# Patient Record
Sex: Male | Born: 2000 | Race: White | Hispanic: No | Marital: Single | Smoking: Never smoker
Health system: Southern US, Community
[De-identification: ages and names within clinical notes are randomized; demographics above are authoritative.]

## PROBLEM LIST (undated history)

## (undated) DIAGNOSIS — G4700 Insomnia, unspecified: Secondary | ICD-10-CM

## (undated) DIAGNOSIS — F909 Attention-deficit hyperactivity disorder, unspecified type: Secondary | ICD-10-CM

## (undated) DIAGNOSIS — F419 Anxiety disorder, unspecified: Secondary | ICD-10-CM

## (undated) DIAGNOSIS — I10 Essential (primary) hypertension: Secondary | ICD-10-CM

## (undated) DIAGNOSIS — F32A Depression, unspecified: Secondary | ICD-10-CM

## (undated) HISTORY — PX: OTHER SURGICAL HISTORY: SHX169

## (undated) HISTORY — DX: Anxiety disorder, unspecified: F41.9

## (undated) HISTORY — DX: Depression, unspecified: F32.A

## (undated) HISTORY — DX: Insomnia, unspecified: G47.00

## (undated) HISTORY — DX: Attention-deficit hyperactivity disorder, unspecified type: F90.9

---
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
## 2021-01-07 ENCOUNTER — Other Ambulatory Visit: Payer: Self-pay

## 2021-01-07 ENCOUNTER — Telehealth (INDEPENDENT_AMBULATORY_CARE_PROVIDER_SITE_OTHER): Payer: BC Managed Care – PPO | Admitting: Child and Adolescent Psychiatry

## 2021-01-07 DIAGNOSIS — F418 Other specified anxiety disorders: Secondary | ICD-10-CM

## 2021-01-07 DIAGNOSIS — F3341 Major depressive disorder, recurrent, in partial remission: Secondary | ICD-10-CM | POA: Diagnosis not present

## 2021-01-07 DIAGNOSIS — F902 Attention-deficit hyperactivity disorder, combined type: Secondary | ICD-10-CM | POA: Diagnosis not present

## 2021-01-07 MED ORDER — SERTRALINE HCL 50 MG PO TABS: 75.0000 mg | ORAL_TABLET | Freq: Every day | ORAL | 1 refills | Status: DC

## 2021-01-07 MED ORDER — BUPROPION HCL ER (XL) 300 MG PO TB24: 300.0000 mg | ORAL_TABLET | Freq: Every day | ORAL | 0 refills | Status: DC

## 2021-01-07 MED ORDER — HYDROXYZINE HCL 25 MG PO TABS: 25.0000 mg | ORAL_TABLET | Freq: Three times a day (TID) | ORAL | 1 refills | Status: DC | PRN

## 2021-01-07 MED ORDER — TRAZODONE HCL 50 MG PO TABS: ORAL_TABLET | ORAL | 0 refills | Status: DC

## 2021-01-07 NOTE — Progress Notes (Signed)
Virtual Visit via Video Note  I connected with Kevin Wilkerson on 01/07/21 at 10:30 AM EST by a video enabled telemedicine application and verified that I am speaking with the correct person using two identifiers.  Location: Patient: home Provider: office   I discussed the limitations of evaluation and management by telemedicine and the availability of in person appointments. The patient expressed understanding and agreed to proceed.    I discussed the assessment and treatment plan with the patient. The patient was provided an opportunity to ask questions and all were answered. The patient agreed with the plan and demonstrated an understanding of the instructions.   The patient was advised to call back or seek an in-person evaluation if the symptoms worsen or if the condition fails to improve as anticipated.  I provided  20 minutes of non-face-to-face time during this encounter.   [name], MD    [facility] MD/PA/NP OP Progress Note  01/07/2021 11:25 AM Kevin Wilkerson  MRN:  [id number]  Chief Complaint:   Medication management follow-up for ADHD, anxiety, depression.    HPI:  This is an 20 year old Caucasian male with psychiatric history significant of ADHD, MDD, anxiety was last prescribed Wellbutrin XL 300 mg once a day, Jornay PM 20 mg once a day at 8:52 PM and Zoloft 50 mg once a day and hydroxyzine and trazodone as needed for sleeping difficulties.   Kevin Wilkerson was seen and evaluated over telemedicine encounter for medication management follow-up.  He was present by himself at his college campus.  He reports that he has made a decision to drop out of college because he has not been liking the work and also does not have money to support himself.  He reports that he despises the work.  He reports that he is planning to move back with his parents for a month, start working and then move to his own place.  He reports that he has been increasingly anxious because of moving back with parents  and finding a job.  He reports that his mood has been "okay", denies any low lows or depressed mood.  He denies anhedonia.  He reports that he is looking forward to start working rather than being at college.  He denies any SI/HI.  He reports that he has stayed compliant with his medications and medications have continued to help especially Jornay PM has been helping him getting up on time and helping him throughout the day until 4 PM for attention problems.  He also reports that he has continued to see his therapist about once a week.  I discussed with him to increase the dose of Zoloft to 75 mg once a day while keeping other current medications.  He verbalized understanding and agreed with this plan.  He will follow back again in 3 weeks or earlier if needed.   Visit Diagnosis:    ICD-10-CM   1. Recurrent major depressive disorder, in partial remission (HCC)  F33.41 buPROPion (WELLBUTRIN XL) 300 MG 24 hr tablet    sertraline (ZOLOFT) 50 MG tablet    2. Attention deficit hyperactivity disorder (ADHD), combined type  F90.2     3. Other specified anxiety disorders  F41.8 sertraline (ZOLOFT) 50 MG tablet    hydrOXYzine (ATARAX) 25 MG tablet       Past Psychiatric History: As mentioned in initial H&P, reviewed today, no change  Past Medical History:  Past Medical History:  Diagnosis Date   ADHD (attention deficit hyperactivity disorder)  Hypertension     Past Surgical History:  Procedure Laterality Date   arm surgery Left     Family Psychiatric History: As mentioned in initial H&P, reviewed today, no change  Family History:  Family History  Problem Relation Age of Onset   Bipolar disorder Mother    Depression Mother    Anxiety disorder Mother    Anxiety disorder Father    Depression Father    Panic disorder Brother     Social History:  Social History   Socioeconomic History   Marital status: Single    Spouse name: Not on file   Number of children: Not on file   Years  of education: Not on file   Highest education level: 11th grade  Occupational History   Not on file  Tobacco Use   Smoking status: Never   Smokeless tobacco: Never  Vaping Use   Vaping Use: Never used  Substance and Sexual Activity   Alcohol use: Never   Drug use: Never   Sexual activity: Never  Other Topics Concern   Not on file  Social History Narrative   Not on file   Social Determinants of Health   Financial Resource Strain: Not on file  Food Insecurity: Not on file  Transportation Needs: Not on file  Physical Activity: Not on file  Stress: Not on file  Social Connections: Not on file    Allergies: No Known Allergies  Metabolic Disorder Labs: No results found for: HGBA1C, MPG No results found for: PROLACTIN No results found for: CHOL, TRIG, HDL, CHOLHDL, VLDL, LDLCALC No results found for: TSH  Therapeutic Level Labs: No results found for: LITHIUM No results found for: VALPROATE No components found for:  CBMZ  Current Medications: Current Outpatient Medications  Medication Sig Dispense Refill   buPROPion (WELLBUTRIN XL) 300 MG 24 hr tablet Take 1 tablet (300 mg total) by mouth daily. 30 tablet 0   hydrOXYzine (ATARAX) 25 MG tablet Take 1 tablet (25 mg total) by mouth every 8 (eight) hours as needed for anxiety. Can take additional one tablet(25 mg total) at night for sleeping difficulties as needed. 30 tablet 1   Methylphenidate HCl ER, PM, (JORNAY PM) 20 MG CP24 Take 1 tablet (20 mg total) by mouth daily at 8-9 pm. 30 capsule 0   Methylphenidate HCl ER, PM, (JORNAY PM) 20 MG CP24 Take 1 tablet (20 mg total) by mouth daily at 8-9 pm. 30 capsule 0   sertraline (ZOLOFT) 50 MG tablet Take 1.5 tablets (75 mg total) by mouth daily. 45 tablet 1   traZODone (DESYREL) 50 MG tablet TAKE 1/2 TO 1 (ONE-HALF TO ONE) TABLET BY MOUTH AT BEDTIME PRN  FOR  SLEEP 30 tablet 0   No current facility-administered medications for this visit.     Musculoskeletal: Strength &  Muscle Tone: unable to assess since visit was over the telemedicine. Gait & Station: unable to assess since visit was over the telemedicine. Patient leans: N/A  Psychiatric Specialty Exam: Review of Systems  There were no vitals taken for this visit.There is no height or weight on file to calculate BMI.  General Appearance: Casual and Fairly Groomed  Eye Contact:  Fair  Speech:  Clear and Coherent and Normal Rate  Volume:  Normal  Mood:   "good..."  Affect:  Appropriate, Congruent, and Full Range  Thought Process:  Goal Directed and Linear  Orientation:  Full (Time, Place, and Person)  Thought Content: Logical   Suicidal Thoughts:  No  Homicidal Thoughts:  No  Memory:  Immediate;   Fair Recent;   Fair Remote;   Fair  Judgement:  Fair  Insight:  Fair  Psychomotor Activity:  Normal  Concentration:  Concentration: Fair and Attention Span: Fair  Recall:  [employer] of Knowledge: Fair  Language: Fair  Akathisia:  No    AIMS (if indicated): not done  Assets:  Communication Skills Desire for Improvement Financial Resources/Insurance Housing Leisure Time Physical Health Social Support Transportation Vocational/Educational  ADL's:  Intact  Cognition: WNL  Sleep:  Good     Screenings: PHQ2-9    Flowsheet Row Video Visit from [facility]/19/2020 in [facility]  PHQ-2 Total Score 2  PHQ-9 Total Score 13      Flowsheet Row ED from 03/07/2020 in [facility] [place]   C-SSRS RISK CATEGORY No Risk        Assessment and Plan:   20 year old Caucasian boy with 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   He reported history suggestive of significant anxiety, recurrent depression and ADHD on intake in the context of chronic psychosocial stressors and his genetic predisposition.    Reviewed response to current medications on 12/02.  He appears to have  worsening of anxiety in the context of dropping out of college, moving back with parents.  He denies symptoms consistent with MDD at this time.  He appears to have continued stability with his attention problem.  He has cut down on his marijuana and now smokes about twice a week.  He denies any other substance abuse.    Plan as below   Plan:   # Anxiety(chronic, unstable) - Increase Zoloft to 75 mg once a day.  - Atrax 25 mg TID PRN for anxiety and QHS PRN for sleeping difficulties.  - Continue Wellbutrin XL 300 mg once a day. - Therapy with Ms. Patty Sprouse   # Depression(recurrent, in remission) -Medication as mentioned above.    -Therapy with Ms. Patty Sprouse   # ADHD - Continue with Jornay PM 20 mg daily.    This note was generated in part or whole with voice recognition software. Voice recognition is usually quite accurate but there are transcription errors that can and very often do occur. I apologize for any typographical errors that were not detected and corrected.   MDM = 2 or more chronic conditions + med management    [name], MD 01/07/2021, 11:25 AM
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
## 2021-01-27 ENCOUNTER — Telehealth: Payer: BC Managed Care – PPO | Admitting: Child and Adolescent Psychiatry

## 2021-01-27 ENCOUNTER — Telehealth: Payer: Self-pay | Admitting: Child and Adolescent Psychiatry

## 2021-01-27 ENCOUNTER — Other Ambulatory Visit: Payer: Self-pay

## 2021-01-27 NOTE — Telephone Encounter (Signed)
Pt was sent link via text and email to connect on video for telemedicine encounter for scheduled appointment, and was also followed up with phone call. Pt did not connect on the video, and writer left the VM requesting to connect on the video or call back to reschedule appointment if they are not able to connect today for appointment.   

## 2021-02-22 ENCOUNTER — Telehealth (INDEPENDENT_AMBULATORY_CARE_PROVIDER_SITE_OTHER): Payer: BC Managed Care – PPO | Admitting: Child and Adolescent Psychiatry

## 2021-02-22 ENCOUNTER — Encounter: Payer: Self-pay | Admitting: Child and Adolescent Psychiatry

## 2021-02-22 ENCOUNTER — Other Ambulatory Visit: Payer: Self-pay

## 2021-02-22 DIAGNOSIS — F902 Attention-deficit hyperactivity disorder, combined type: Secondary | ICD-10-CM | POA: Diagnosis not present

## 2021-02-22 DIAGNOSIS — F418 Other specified anxiety disorders: Secondary | ICD-10-CM

## 2021-02-22 DIAGNOSIS — F331 Major depressive disorder, recurrent, moderate: Secondary | ICD-10-CM | POA: Diagnosis not present

## 2021-02-22 MED ORDER — BUPROPION HCL ER (XL) 300 MG PO TB24
300.0000 mg | ORAL_TABLET | Freq: Every day | ORAL | 0 refills | Status: DC
Start: 2021-02-22 — End: 2021-04-05

## 2021-02-22 MED ORDER — SERTRALINE HCL 100 MG PO TABS: 100.0000 mg | ORAL_TABLET | Freq: Every day | ORAL | 1 refills | Status: DC

## 2021-02-22 MED ORDER — TRAZODONE HCL 50 MG PO TABS
ORAL_TABLET | ORAL | 0 refills | Status: DC
Start: 2021-02-22 — End: 2021-04-05

## 2021-02-22 NOTE — Progress Notes (Signed)
Virtual Visit via Video Note  I connected with Kevin Wilkerson on 02/22/21 at  1:00 PM EST by a video enabled telemedicine application and verified that I am speaking with the correct person using two identifiers.  Location: Patient: home Provider: office   I discussed the limitations of evaluation and management by telemedicine and the availability of in person appointments. The patient expressed understanding and agreed to proceed.    I discussed the assessment and treatment plan with the patient. The patient was provided an opportunity to ask questions and all were answered. The patient agreed with the plan and demonstrated an understanding of the instructions.   The patient was advised to call back or seek an in-person evaluation if the symptoms worsen or if the condition fails to improve as anticipated.  I provided  20 minutes of non-face-to-face time during this encounter.   [name], MD    [facility] MD/PA/NP OP Progress Note  02/22/2021 1:31 PM Kevin Wilkerson  MRN:  [id number]  Chief Complaint:   Medication management follow-up for ADHD, anxiety, depression.  HPI:  This is a 21 year old Caucasian male with psychiatric history significant of ADHD, MDD, anxiety was last prescribed Wellbutrin XL 300 mg once a day, Jornay PM 20 mg once a day at 8:00 PM and Zoloft 50 mg once a day and hydroxyzine and trazodone as needed for sleeping difficulties.   Kevin Wilkerson was seen and evaluated over telemedicine encounter for medication management follow-up.  He was present by himself at his home and was evaluated alone.  He reports that he is doing "okay", has been feeling depressed on most days since he has moved back to his parent's home however recently went to [place] to meet his girlfriend and that has improved his mood.  He reports that he is looking for jobs, has applied to various different places and waiting to hear.  He reports that he realizes that he needs to get out of the room.  We  discussed to come up with routine where he at least spends 1 hour outside of his room and also fixes his sleep schedule.  He reports that his anxiety was much higher when he left the college but anxiety is improving.  He denies any SI/HI.  He reports that sometimes he overeats and has not been able to take his Jornay PM at night which regulates his appetite and improves his attention because he is not sleeping on time at night.  He reports that he continues to take his medications as prescribed, sees his therapist about once a week.  We discussed to increase the dose of Zoloft to 100 mg once a day while continuing rest of the current medications.  He verbalized understanding and agreed with the plan.  He reports that he has tolerated increased dose of Zoloft to 75 mg well after the last appointment.  He will follow back again in 6 weeks or earlier if needed.  Visit Diagnosis:    ICD-10-CM   1. Moderate episode of recurrent major depressive disorder (HCC)  F33.1 buPROPion (WELLBUTRIN XL) 300 MG 24 hr tablet    sertraline (ZOLOFT) 100 MG tablet    2. Other specified anxiety disorders  F41.8 sertraline (ZOLOFT) 100 MG tablet    3. Attention deficit hyperactivity disorder (ADHD), combined type  F90.2        Past Psychiatric History: As mentioned in initial H&P, reviewed today, no change  Past Medical History:  Past Medical History:  Diagnosis  Date   ADHD (attention deficit hyperactivity disorder)    Hypertension     Past Surgical History:  Procedure Laterality Date   arm surgery Left     Family Psychiatric History: As mentioned in initial H&P, reviewed today, no change  Family History:  Family History  Problem Relation Age of Onset   Bipolar disorder Mother    Depression Mother    Anxiety disorder Mother    Anxiety disorder Father    Depression Father    Panic disorder Brother     Social History:  Social History   Socioeconomic History   Marital status: Single    Spouse name:  Not on file   Number of children: Not on file   Years of education: Not on file   Highest education level: 11th grade  Occupational History   Not on file  Tobacco Use   Smoking status: Never   Smokeless tobacco: Never  Vaping Use   Vaping Use: Never used  Substance and Sexual Activity   Alcohol use: Never   Drug use: Never   Sexual activity: Never  Other Topics Concern   Not on file  Social History Narrative   Not on file   Social Determinants of Health   Financial Resource Strain: Not on file  Food Insecurity: Not on file  Transportation Needs: Not on file  Physical Activity: Not on file  Stress: Not on file  Social Connections: Not on file    Allergies: No Known Allergies  Metabolic Disorder Labs: No results found for: HGBA1C, MPG No results found for: PROLACTIN No results found for: CHOL, TRIG, HDL, CHOLHDL, VLDL, LDLCALC No results found for: TSH  Therapeutic Level Labs: No results found for: LITHIUM No results found for: VALPROATE No components found for:  CBMZ  Current Medications: Current Outpatient Medications  Medication Sig Dispense Refill   buPROPion (WELLBUTRIN XL) 300 MG 24 hr tablet Take 1 tablet (300 mg total) by mouth daily. 30 tablet 0   hydrOXYzine (ATARAX) 25 MG tablet Take 1 tablet (25 mg total) by mouth every 8 (eight) hours as needed for anxiety. Can take additional one tablet(25 mg total) at night for sleeping difficulties as needed. 30 tablet 1   Methylphenidate HCl ER, PM, (JORNAY PM) 20 MG CP24 Take 1 tablet (20 mg total) by mouth daily at 8-9 pm. 30 capsule 0   Methylphenidate HCl ER, PM, (JORNAY PM) 20 MG CP24 Take 1 tablet (20 mg total) by mouth daily at 8-9 pm. 30 capsule 0   sertraline (ZOLOFT) 100 MG tablet Take 1 tablet (100 mg total) by mouth daily. 30 tablet 1   traZODone (DESYREL) 50 MG tablet TAKE 1/2 TO 1 (ONE-HALF TO ONE) TABLET BY MOUTH AT BEDTIME PRN  FOR  SLEEP 30 tablet 0   No current facility-administered medications for  this visit.     Musculoskeletal: Strength & Muscle Tone: unable to assess since visit was over the telemedicine. Gait & Station: unable to assess since visit was over the telemedicine. Patient leans: N/A  Psychiatric Specialty Exam: Review of Systems  There were no vitals taken for this visit.There is no height or weight on file to calculate BMI.  General Appearance: Casual and Fairly Groomed  Eye Contact:  Fair  Speech:  Clear and Coherent and Normal Rate  Volume:  Normal  Mood:   "ok..."  Affect:  Appropriate, Congruent, and Restricted  Thought Process:  Goal Directed and Linear  Orientation:  Full (Time, Place, and Person)  Thought Content: Logical   Suicidal Thoughts:  No  Homicidal Thoughts:  No  Memory:  Immediate;   Fair Recent;   Fair Remote;   Fair  Judgement:  Fair  Insight:  Fair  Psychomotor Activity:  Normal  Concentration:  Concentration: Fair and Attention Span: Fair  Recall:  [employer] of Knowledge: Fair  Language: Fair  Akathisia:  No    AIMS (if indicated): not done  Assets:  Communication Skills Desire for Improvement Financial Resources/Insurance Housing Leisure Time Physical Health Social Support Transportation Vocational/Educational  ADL's:  Intact  Cognition: WNL  Sleep:  Poor     Screenings: PHQ2-9    Flowsheet Row Video Visit from 04/19/2020 in [facility]  PHQ-2 Total Score 2  PHQ-9 Total Score 13      Flowsheet Row ED from 03/07/2020 in [facility] [place]   C-SSRS RISK CATEGORY No Risk        Assessment and Plan:   21 year old Caucasian boy with 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   He reported history suggestive of significant anxiety, recurrent depression and ADHD on intake in the context of chronic psychosocial stressors and his genetic predisposition.    Reviewed response to current  medications on 12/02.  He appears to have worsening of mood and anxiety in the context of dropping out of college, moving back with parents and lack of job.   Recommending to increase Zoloft to 100 mg..  He appears to have continued stability with his attention problem.  He has cut down on his marijuana and now smokes about twice a week.  He denies any other substance abuse.    Plan as below   Plan:   # Anxiety(chronic, unstable) - Increase Zoloft to 100 mg once a day.  - Atrax 25 mg TID PRN for anxiety and QHS PRN for sleeping difficulties.  - Continue Wellbutrin XL 300 mg once a day. - Therapy with Ms. Patty Sprouse   # Depression(recurrent, moderate) -Medication as mentioned above.    -Therapy with Ms. Patty Sprouse   # ADHD - Continue with Jornay PM 20 mg daily.    This note was generated in part or whole with voice recognition software. Voice recognition is usually quite accurate but there are transcription errors that can and very often do occur. I apologize for any typographical errors that were not detected and corrected.   MDM = 2 or more chronic conditions + med management    [name], MD 02/22/2021, 1:31 PM

## 2021-03-29 ENCOUNTER — Telehealth: Payer: Self-pay

## 2021-03-29 DIAGNOSIS — F902 Attention-deficit hyperactivity disorder, combined type: Secondary | ICD-10-CM

## 2021-03-29 MED ORDER — JORNAY PM 20 MG PO CP24
ORAL_CAPSULE | ORAL | 0 refills | Status: DC
Start: 2021-03-29 — End: 2021-08-03

## 2021-03-29 NOTE — Telephone Encounter (Signed)
Rx sent 

## 2021-03-29 NOTE — Telephone Encounter (Signed)
received message that pt needs refills on methylphenidate.

## 2021-04-05 ENCOUNTER — Other Ambulatory Visit: Payer: Self-pay

## 2021-04-05 ENCOUNTER — Telehealth (INDEPENDENT_AMBULATORY_CARE_PROVIDER_SITE_OTHER): Payer: BC Managed Care – PPO | Admitting: Child and Adolescent Psychiatry

## 2021-04-05 DIAGNOSIS — F418 Other specified anxiety disorders: Secondary | ICD-10-CM | POA: Diagnosis not present

## 2021-04-05 DIAGNOSIS — F902 Attention-deficit hyperactivity disorder, combined type: Secondary | ICD-10-CM

## 2021-04-05 DIAGNOSIS — F331 Major depressive disorder, recurrent, moderate: Secondary | ICD-10-CM | POA: Diagnosis not present

## 2021-04-05 MED ORDER — BUPROPION HCL ER (XL) 300 MG PO TB24: 300.0000 mg | ORAL_TABLET | Freq: Every day | ORAL | 1 refills | Status: DC

## 2021-04-05 MED ORDER — JORNAY PM 20 MG PO CP24
ORAL_CAPSULE | ORAL | 0 refills | Status: DC
Start: 2021-04-05 — End: 2021-08-03

## 2021-04-05 MED ORDER — SERTRALINE HCL 100 MG PO TABS: 100.0000 mg | ORAL_TABLET | Freq: Every day | ORAL | 1 refills | Status: DC

## 2021-04-05 MED ORDER — TRAZODONE HCL 50 MG PO TABS: ORAL_TABLET | ORAL | 0 refills | Status: DC

## 2021-04-05 NOTE — Progress Notes (Signed)
Virtual Visit via Video Note  I connected with Kevin Wilkerson on 04/05/21 at 10:30 AM EST by a video enabled telemedicine application and verified that I am speaking with the correct person using two identifiers.  Location: Patient: home Provider: office   I discussed the limitations of evaluation and management by telemedicine and the availability of in person appointments. The patient expressed understanding and agreed to proceed.    I discussed the assessment and treatment plan with the patient. The patient was provided an opportunity to ask questions and all were answered. The patient agreed with the plan and demonstrated an understanding of the instructions.   The patient was advised to call back or seek an in-person evaluation if the symptoms worsen or if the condition fails to improve as anticipated.  I provided  20 minutes of non-face-to-face time during this encounter.   [name], MD    [facility] MD/PA/NP OP Progress Note  04/05/2021 11:20 AM Kevin Wilkerson  MRN:  [id number]  Chief Complaint:   Medication management follow-up for ADHD, anxiety, depression.  HPI:  This is a 21 year old Caucasian male with psychiatric history significant of ADHD, MDD, anxiety was last prescribed Wellbutrin XL 300 mg once a day, Jornay PM 20 mg once a day at 8:00 PM and Zoloft 50 mg once a day and hydroxyzine and trazodone as needed for sleeping difficulties.   Coden was seen and evaluated over telemedicine encounter for medication management follow-up.  He was present by himself at his home and was evaluated alone.  He reports that he is doing okay now but about a week ago he was feeling depressed.  He reports that he was overthinking, crying often, staying in his room a lot, still enjoys watching TV.  When asked if any specific stressor he reports that he has been visiting his girlfriend every week or so in [place] and whenever he comes back he feels depressed.  He is planning to go back to  [place] this weekend.  He also reports anxiety.  He wants to move out of his parent's home.  He reports that he is planning to find a job once he is back from [place] after this week and.  He reports that his parents are supportive and does not bother him but he would like to be more independent.  He reports that overall he has been sleeping well lately, sleep has been restful, goes to see his friends and [employer] and plays video games with them infrequently, otherwise he watches TV most of the time at home.  He reports that he has been eating well.  He denies any suicidal thoughts or homicidal thoughts.  He reports that he continues to see his therapist about once every week or every other week.  We discussed to get back into some routine, discussed to look into jobs as it may provide structure and routine for him and he may feel more accomplished and productive which would help with mood and anxiety.  He reports that he has been compliant to his medications.  From medication refill history, he does not seem to be compliant with his Jornay PM.  He denies problems with his medications.  We discussed to continue with current medications his depression seem situational when he returns from [place] and it improves over the time once he is at home.  Recommended to continue with therapy as well.  He will follow back again in a month or earlier if  needed.   Visit Diagnosis:    ICD-10-CM   1. Attention deficit hyperactivity disorder (ADHD), combined type  F90.2 Methylphenidate HCl ER, PM, (JORNAY PM) 20 MG CP24    2. Moderate episode of recurrent major depressive disorder (HCC)  F33.1 buPROPion (WELLBUTRIN XL) 300 MG 24 hr tablet    sertraline (ZOLOFT) 100 MG tablet    3. Other specified anxiety disorders  F41.8 sertraline (ZOLOFT) 100 MG tablet        Past Psychiatric History: As mentioned in initial H&P, reviewed today, no change  Past Medical History:  Past Medical History:  Diagnosis Date    ADHD (attention deficit hyperactivity disorder)    Hypertension     Past Surgical History:  Procedure Laterality Date   arm surgery Left     Family Psychiatric History: As mentioned in initial H&P, reviewed today, no change  Family History:  Family History  Problem Relation Age of Onset   Bipolar disorder Mother    Depression Mother    Anxiety disorder Mother    Anxiety disorder Father    Depression Father    Panic disorder Brother     Social History:  Social History   Socioeconomic History   Marital status: Single    Spouse name: Not on file   Number of children: Not on file   Years of education: Not on file   Highest education level: 11th grade  Occupational History   Not on file  Tobacco Use   Smoking status: Never   Smokeless tobacco: Never  Vaping Use   Vaping Use: Never used  Substance and Sexual Activity   Alcohol use: Never   Drug use: Never   Sexual activity: Never  Other Topics Concern   Not on file  Social History Narrative   Not on file   Social Determinants of Health   Financial Resource Strain: Not on file  Food Insecurity: Not on file  Transportation Needs: Not on file  Physical Activity: Not on file  Stress: Not on file  Social Connections: Not on file    Allergies: No Known Allergies  Metabolic Disorder Labs: No results found for: HGBA1C, MPG No results found for: PROLACTIN No results found for: CHOL, TRIG, HDL, CHOLHDL, VLDL, LDLCALC No results found for: TSH  Therapeutic Level Labs: No results found for: LITHIUM No results found for: VALPROATE No components found for:  CBMZ  Current Medications: Current Outpatient Medications  Medication Sig Dispense Refill   buPROPion (WELLBUTRIN XL) 300 MG 24 hr tablet Take 1 tablet (300 mg total) by mouth daily. 30 tablet 1   hydrOXYzine (ATARAX) 25 MG tablet Take 1 tablet (25 mg total) by mouth every 8 (eight) hours as needed for anxiety. Can take additional one tablet(25 mg total) at  night for sleeping difficulties as needed. 30 tablet 1   Methylphenidate HCl ER, PM, (JORNAY PM) 20 MG CP24 Take 1 tablet (20 mg total) by mouth daily at 8-9 pm. 30 capsule 0   Methylphenidate HCl ER, PM, (JORNAY PM) 20 MG CP24 Take 1 tablet (20 mg total) by mouth daily at 8-9 pm. 30 capsule 0   sertraline (ZOLOFT) 100 MG tablet Take 1 tablet (100 mg total) by mouth daily. 30 tablet 1   traZODone (DESYREL) 50 MG tablet TAKE 1/2 TO 1 (ONE-HALF TO ONE) TABLET BY MOUTH AT BEDTIME PRN  FOR  SLEEP 30 tablet 0   No current facility-administered medications for this visit.     Musculoskeletal: Strength & Muscle  Tone: unable to assess since visit was over the telemedicine. Gait & Station: unable to assess since visit was over the telemedicine. Patient leans: N/A  Psychiatric Specialty Exam: Review of Systems  There were no vitals taken for this visit.There is no height or weight on file to calculate BMI.  General Appearance: Casual and Disheveled  Eye Contact:  Fair  Speech:  Clear and Coherent and Normal Rate  Volume:  Normal  Mood:   "ok"  Affect:  Appropriate, Congruent, and Restricted  Thought Process:  Goal Directed and Linear  Orientation:  Full (Time, Place, and Person)  Thought Content: Logical   Suicidal Thoughts:  No  Homicidal Thoughts:  No  Memory:  Immediate;   Fair Recent;   Fair Remote;   Fair  Judgement:  Fair  Insight:  Fair  Psychomotor Activity:  Normal  Concentration:  Concentration: Fair and Attention Span: Fair  Recall:  [employer] of Knowledge: Fair  Language: Fair  Akathisia:  No    AIMS (if indicated): not done  Assets:  Communication Skills Desire for Improvement Financial Resources/Insurance Housing Leisure Time Physical Health Social Support Transportation Vocational/Educational  ADL's:  Intact  Cognition: WNL  Sleep:  Fair     Screenings: PHQ2-9    Flowsheet Row Video Visit [facility]/19/2020 in [facility]   PHQ-2 Total Score 2  PHQ-9 Total Score 13      Flowsheet Row ED from 03/07/2020 in [facility] [place]   C-SSRS RISK CATEGORY No Risk        Assessment and Plan:   21 year old Caucasian boy with 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   He reported history suggestive of significant anxiety, recurrent depression and ADHD on intake in the context of chronic psychosocial stressors and his genetic predisposition.    Reviewed response to current medications on 02/28.  He appears to have overall stability with mood and and anxiety. Does have intermittent depressive episode which appears situational. Continues to see his therapist.    Plan as below   Plan:   # Anxiety(chronic, stable) - Continue with Zoloft 100 mg once a day.  - Atrax 25 mg TID PRN for anxiety and QHS PRN for sleeping difficulties.  - Continue Wellbutrin XL 300 mg once a day. - Therapy with Ms. Patty Sprouse   # Depression(recurrent, mild) -Medication as mentioned above.    -Therapy with Ms. Patty Sprouse   # ADHD - Continue with Jornay PM 20 mg daily.    This note was generated in part or whole with voice recognition software. Voice recognition is usually quite accurate but there are transcription errors that can and very often do occur. I apologize for any typographical errors that were not detected and corrected.   MDM = 2 or more chronic conditions + med management    [name], MD 04/05/2021, 11:20 AM

## 2021-04-12 DIAGNOSIS — F410 Panic disorder [episodic paroxysmal anxiety] without agoraphobia: Secondary | ICD-10-CM | POA: Diagnosis not present

## 2021-04-25 DIAGNOSIS — F410 Panic disorder [episodic paroxysmal anxiety] without agoraphobia: Secondary | ICD-10-CM | POA: Diagnosis not present

## 2021-05-03 ENCOUNTER — Telehealth (INDEPENDENT_AMBULATORY_CARE_PROVIDER_SITE_OTHER): Payer: BC Managed Care – PPO | Admitting: Child and Adolescent Psychiatry

## 2021-05-03 ENCOUNTER — Other Ambulatory Visit: Payer: Self-pay

## 2021-05-03 DIAGNOSIS — F902 Attention-deficit hyperactivity disorder, combined type: Secondary | ICD-10-CM

## 2021-05-03 DIAGNOSIS — F330 Major depressive disorder, recurrent, mild: Secondary | ICD-10-CM

## 2021-05-03 DIAGNOSIS — F418 Other specified anxiety disorders: Secondary | ICD-10-CM | POA: Diagnosis not present

## 2021-05-03 MED ORDER — HYDROXYZINE HCL 25 MG PO TABS: 25.0000 mg | ORAL_TABLET | Freq: Three times a day (TID) | ORAL | 1 refills | Status: DC | PRN

## 2021-05-03 MED ORDER — TRAZODONE HCL 50 MG PO TABS: ORAL_TABLET | ORAL | 0 refills | Status: DC

## 2021-05-03 NOTE — Progress Notes (Signed)
Virtual Visit via Video Note ? ?I connected with Kevin Wilkerson on 05/03/21 at 10:00 AM EDT by a video enabled telemedicine application and verified that I am speaking with the correct person using two identifiers. ? ?Location: ?Patient: home ?Provider: office ?  ?I discussed the limitations of evaluation and management by telemedicine and the availability of in person appointments. The patient expressed understanding and agreed to proceed. ? ?  ?I discussed the assessment and treatment plan with the patient. The patient was provided an opportunity to ask questions and all were answered. The patient agreed with the plan and demonstrated an understanding of the instructions. ?  ?The patient was advised to call back or seek an in-person evaluation if the symptoms worsen or if the condition fails to improve as anticipated. ? ?I provided  20 minutes of non-face-to-face time during this encounter. ? ? ?Kevin SmallingHiren M Cabell Lazenby, MD ? ? ? ?BH MD/PA/NP OP Progress Note ? ?05/03/2021 10:44 AM ?Kevin Wilkerson  ?MRN:  [id number] ? ?Chief Complaint:   Medication management follow-up for ADHD, anxiety, depression.   ? ?HPI: ? ?This is a 21 year old Caucasian male with psychiatric history significant of ADHD, MDD, anxiety was last prescribed Wellbutrin XL 300 mg once a day, Jornay PM 20 mg once a day at 8:00 PM and Zoloft 100 mg once a day and hydroxyzine and trazodone as needed for sleeping difficulties.  ? ?Kevin BattenCody was seen and evaluated over telemedicine encounter for medication management follow-up.  He was present by himself at his home and was evaluated alone.  He reports that he is doing well now however about a week ago for about 1 to 2 weeks he was feeling depressed in the context of not being able to see his girlfriend.  He reports that he was able to get his girlfriend a ticket to travel to from [place] to hear and since then his mood has been better.  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  He reports during the week when he felt depressed, he did not have motivation to do things, had low appetite and excessive sleep.  He denied having any suicidal thoughts during that time.  At present he reports that his sleep routine is still a problem, his mood is "good", denies anhedonia and enjoys watching movies at home, appetite has improved and continues to deny any suicidal thoughts.  He denies having any anxiety problems except occasional panic attacks.  He reports that he uses coping skills and hydroxyzine to help him with his panic attacks.  Otherwise he denies any excessive worries or anxiety feelings.  He reports that he has been taking his medications without any side effects.  However when checked his compliance and his history of feeling his medications, it appeared that he does not feel his medications every 30 days.  Discussed the importance of medication adherence and his treatment.  He verbalizes understanding and agrees to work on this.  He continues to see his therapist about once a week and they are continuing to work on managing his panic attacks.  He will follow back again in about a month or earlier if needed. ? ?Visit Diagnosis:  ?  ICD-10-CM   ?1. Mild episode of recurrent major depressive disorder (HCC)  F33.0   ?  ?2. Attention deficit hyperactivity disorder (ADHD), combined type  F90.2   ?  ?3.  Other specified anxiety disorders  F41.8 hydrOXYzine (ATARAX) 25 MG tablet  ?  ? ? ? ? ?Past Psychiatric History: As mentioned in initial H&P, reviewed today, no change ? ?Past Medical History:  ?Past Medical History:  ?Diagnosis Date  ? ADHD (attention deficit hyperactivity disorder)   ? Hypertension   ?  ?Past Surgical History:  ?Procedure Laterality Date  ? arm surgery Left   ? ? ?Family Psychiatric History: As mentioned in initial H&P, reviewed today, no change ? ?Family History:  ?Family History  ?Problem Relation Age of Onset   ? Bipolar disorder Mother   ? Depression Mother   ? Anxiety disorder Mother   ? Anxiety disorder Father   ? Depression Father   ? Panic disorder Brother   ? ? ?Social History:  ?Social History  ? ?Socioeconomic History  ? Marital status: Single  ?  Spouse name: Not on file  ? Number of children: Not on file  ? Years of education: Not on file  ? Highest education level: 11th grade  ?Occupational History  ? Not on file  ?Tobacco Use  ? Smoking status: Never  ? Smokeless tobacco: Never  ?Vaping Use  ? Vaping Use: Never used  ?Substance and Sexual Activity  ? Alcohol use: Never  ? Drug use: Never  ? Sexual activity: Never  ?Other Topics Concern  ? Not on file  ?Social History Narrative  ? Not on file  ? ?Social Determinants of Health  ? ?Financial Resource Strain: Not on file  ?Food Insecurity: Not on file  ?Transportation Needs: Not on file  ?Physical Activity: Not on file  ?Stress: Not on file  ?Social Connections: Not on file  ? ? ?Allergies: No Known Allergies ? ?Metabolic Disorder Labs: ?No results found for: HGBA1C, MPG ?No results found for: PROLACTIN ?No results found for: CHOL, TRIG, HDL, CHOLHDL, VLDL, LDLCALC ?No results found for: TSH ? ?Therapeutic Level Labs: ?No results found for: LITHIUM ?No results found for: VALPROATE ?No components found for:  CBMZ ? ?Current Medications: ?Current Outpatient Medications  ?Medication Sig Dispense Refill  ? buPROPion (WELLBUTRIN XL) 300 MG 24 hr tablet Take 1 tablet (300 mg total) by mouth daily. 30 tablet 1  ? hydrOXYzine (ATARAX) 25 MG tablet Take 1 tablet (25 mg total) by mouth every 8 (eight) hours as needed for anxiety. Can take additional one tablet(25 mg total) at night for sleeping difficulties as needed. 30 tablet 1  ? Methylphenidate HCl ER, PM, (JORNAY PM) 20 MG CP24 Take 1 tablet (20 mg total) by mouth daily at 8-9 pm. 30 capsule 0  ? Methylphenidate HCl ER, PM, (JORNAY PM) 20 MG CP24 Take 1 tablet (20 mg total) by mouth daily at 8-9 pm. 30 capsule 0  ?  sertraline (ZOLOFT) 100 MG tablet Take 1 tablet (100 mg total) by mouth daily. 30 tablet 1  ? traZODone (DESYREL) 50 MG tablet TAKE 1/2 TO 1 (ONE-HALF TO ONE) TABLET BY MOUTH AT BEDTIME PRN  FOR  SLEEP 30 tablet 0  ? ?No current facility-administered medications for this visit.  ? ? ? ?Musculoskeletal: ?Strength & Muscle Tone: unable to assess since visit was over the telemedicine. ?Gait & Station: unable to assess since visit was over the telemedicine. ?Patient leans: N/A ? ?Psychiatric Specialty Exam: ?Review of Systems  ?There were no vitals taken for this visit.There is no height or weight on file to calculate BMI.  ?General Appearance: Casual and Disheveled  ?Eye Contact:  Fair  ?Speech:  Clear and Coherent and Normal Rate  ?Volume:  Normal  ?Mood:   "ok"  ?Affect:  Appropriate, Congruent, and Restricted  ?Thought Process:  Goal Directed and Linear  ?Orientation:  Full (Time, Place, and Person)  ?Thought Content: Logical   ?Suicidal Thoughts:  No  ?Homicidal Thoughts:  No  ?Memory:  Immediate;   Fair ?Recent;   Fair ?Remote;   Fair  ?Judgement:  Fair  ?Insight:  Fair  ?Psychomotor Activity:  Normal  ?Concentration:  Concentration: Fair and Attention Span: Fair  ?Recall:  Fair  ?Fund of Knowledge: Fair  ?Language: Fair  ?Akathisia:  No  ?  ?AIMS (if indicated): not done  ?Assets:  Communication Skills ?Desire for Improvement ?Financial Resources/Insurance ?Housing ?Leisure Time ?Physical Health ?Social Support ?Transportation ?Vocational/Educational  ?ADL's:  Intact  ?Cognition: WNL  ?Sleep:  Fair  ?  ? ?Screenings: ?PHQ2-9   ? ?Flowsheet Row Video Visit from 04/19/2020 in [facility]  ?PHQ-2 Total Score 2  ?PHQ-9 Total Score 13  ? ?  ? ?Flowsheet Row ED from 03/07/2020 in [facility]   ?C-SSRS RISK CATEGORY No Risk  ? ?  ? ? ? ?Assessment and Plan:  ? ?21 year old Caucasian boy with significant genetic predisposition to psychiatric illness and personal  psychiatric history of ADHD, MDD and anxiety disorders.   He reported history suggestive of significant anxiety, recurrent depression and ADHD on intake in the context of chronic psychosocial stressors and his gen

## 2021-06-01 DIAGNOSIS — F410 Panic disorder [episodic paroxysmal anxiety] without agoraphobia: Secondary | ICD-10-CM | POA: Diagnosis not present

## 2021-06-07 ENCOUNTER — Telehealth (INDEPENDENT_AMBULATORY_CARE_PROVIDER_SITE_OTHER): Payer: BC Managed Care – PPO | Admitting: Child and Adolescent Psychiatry

## 2021-06-07 DIAGNOSIS — F902 Attention-deficit hyperactivity disorder, combined type: Secondary | ICD-10-CM | POA: Diagnosis not present

## 2021-06-07 DIAGNOSIS — F418 Other specified anxiety disorders: Secondary | ICD-10-CM | POA: Diagnosis not present

## 2021-06-07 DIAGNOSIS — F3341 Major depressive disorder, recurrent, in partial remission: Secondary | ICD-10-CM

## 2021-06-07 MED ORDER — TRAZODONE HCL 50 MG PO TABS: ORAL_TABLET | ORAL | 0 refills | Status: DC

## 2021-06-07 MED ORDER — BUPROPION HCL ER (XL) 300 MG PO TB24: 300.0000 mg | ORAL_TABLET | Freq: Every day | ORAL | 1 refills | Status: DC

## 2021-06-07 NOTE — Progress Notes (Signed)
Virtual Visit via Video Note ? ?I connected with Kevin Wilkerson on 06/07/21 at 10:30 AM EDT by a video enabled telemedicine application and verified that I am speaking with the correct person using two identifiers. ? ?Location: ?Patient: home ?Provider: office ?  ?I discussed the limitations of evaluation and management by telemedicine and the availability of in person appointments. The patient expressed understanding and agreed to proceed. ? ?  ?I discussed the assessment and treatment plan with the patient. The patient was provided an opportunity to ask questions and all were answered. The patient agreed with the plan and demonstrated an understanding of the instructions. ?  ?The patient was advised to call back or seek an in-person evaluation if the symptoms worsen or if the condition fails to improve as anticipated. ? ?I provided 20 minutes of non-face-to-face time during this encounter. ? ? ?Orlene Erm, MD ? ? ? ?[facility] MD/PA/NP OP Progress Note ? ?06/07/2021 10:59 AM ?Kevin Wilkerson  ?MRN:  [id number] ? ?Chief Complaint:   Medication management follow-up for ADHD, anxiety and depression. ? ?HPI: ? ?This is a 21 year old Caucasian male with psychiatric history significant of ADHD, MDD, anxiety was last prescribed Wellbutrin XL 300 mg once a day, Jornay PM 20 mg once a day at 8:00 PM and Zoloft 100 mg once a day and hydroxyzine and trazodone as needed for sleeping difficulties.  ? ?Kevin Wilkerson was seen and evaluated over telemedicine encounter for medication management follow-up.  He was present by himself at his home and was evaluated alone. Appointment was attended by rotating medical student and pt and parent provided informed consent to allow med student attend the appointment.   ? ?Kevin Wilkerson reports that he is doing "good".  He reports that he is staying busy and that his helping him, he also has some routine on what he needs to do the next day and that has been helpful.  He reports that after his girlfriend left after  staying with him for about 2 weeks, he became depressed for about 4 days and eventually his mood improved.  He rates his mood around 5 out of 10, 10 being the best mood on most days, spends time doing chores around the home, watching movies, talking to his friends and his girlfriend.  He reports that he will be visiting his girlfriend in 2 weeks and therefore he is not looking for a job now but planning to start a job afterwards. Her reports anxiety is well managed and rates it at 4/10(10 = most anxious). ? ?He reports that things are going well at home, gets along well with his parents, his grandmother is moving so he will get a new room for himself.  He reports that he is sleeping well however on some days if he is not able to fall asleep within an hour of taking Jornay PM, he does not sleep well.  He reports that having poor sleep leads to more irritability which occurred last week but now he has been sleeping better.  He reports that he takes [place] PM every other day and whenever he takes it he feels more motivated and more focused. ? ?He denies any SI or HI.  He has been eating okay.  He denies any problems with his medications except as mentioned above for [facility] PM.  He also reports that he has been completely adherent to his medications.  We discussed to continue with current medicines and follow back again in 6 weeks or earlier if  needed.  He also continues to see his therapist at least every 2 weeks.  They are working on improving his anxiety in social situations such as workplaces. ?Visit Diagnosis:  ?  ICD-10-CM   ?1. Other specified anxiety disorders  F41.8   ?  ?2. Recurrent major depressive disorder, in partial remission (HCC)  F33.41 buPROPion (WELLBUTRIN XL) 300 MG 24 hr tablet  ?  ?3. Attention deficit hyperactivity disorder (ADHD), combined type  F90.2   ?  ? ? ? ? ?Past Psychiatric History: As mentioned in initial H&P, reviewed today, no change ? ?Past Medical History:  ?Past Medical History:   ?Diagnosis Date  ? ADHD (attention deficit hyperactivity disorder)   ? Hypertension   ?  ?Past Surgical History:  ?Procedure Laterality Date  ? arm surgery Left   ? ? ?Family Psychiatric History: As mentioned in initial H&P, reviewed today, no change ? ?Family History:  ?Family History  ?Problem Relation Age of Onset  ? Bipolar disorder Mother   ? Depression Mother   ? Anxiety disorder Mother   ? Anxiety disorder Father   ? Depression Father   ? Panic disorder Brother   ? ? ?Social History:  ?Social History  ? ?Socioeconomic History  ? Marital status: Single  ?  Spouse name: Not on file  ? Number of children: Not on file  ? Years of education: Not on file  ? Highest education level: 11th grade  ?Occupational History  ? Not on file  ?Tobacco Use  ? Smoking status: Never  ? Smokeless tobacco: Never  ?Vaping Use  ? Vaping Use: Never used  ?Substance and Sexual Activity  ? Alcohol use: Never  ? Drug use: Never  ? Sexual activity: Never  ?Other Topics Concern  ? Not on file  ?Social History Narrative  ? Not on file  ? ?Social Determinants of Health  ? ?Financial Resource Strain: Not on file  ?Food Insecurity: Not on file  ?Transportation Needs: Not on file  ?Physical Activity: Not on file  ?Stress: Not on file  ?Social Connections: Not on file  ? ? ?Allergies: No Known Allergies ? ?Metabolic Disorder Labs: ?No results found for: HGBA1C, MPG ?No results found for: PROLACTIN ?No results found for: CHOL, TRIG, HDL, CHOLHDL, VLDL, LDLCALC ?No results found for: TSH ? ?Therapeutic Level Labs: ?No results found for: LITHIUM ?No results found for: VALPROATE ?No components found for:  CBMZ ? ?Current Medications: ?Current Outpatient Medications  ?Medication Sig Dispense Refill  ? buPROPion (WELLBUTRIN XL) 300 MG 24 hr tablet Take 1 tablet (300 mg total) by mouth daily. 30 tablet 1  ? hydrOXYzine (ATARAX) 25 MG tablet Take 1 tablet (25 mg total) by mouth every 8 (eight) hours as needed for anxiety. Can take additional one  tablet(25 mg total) at night for sleeping difficulties as needed. 30 tablet 1  ? Methylphenidate HCl ER, PM, (JORNAY PM) 20 MG CP24 Take 1 tablet (20 mg total) by mouth daily at 8-9 pm. 30 capsule 0  ? Methylphenidate HCl ER, PM, (JORNAY PM) 20 MG CP24 Take 1 tablet (20 mg total) by mouth daily at 8-9 pm. 30 capsule 0  ? sertraline (ZOLOFT) 100 MG tablet Take 1 tablet (100 mg total) by mouth daily. 30 tablet 1  ? traZODone (DESYREL) 50 MG tablet TAKE 1/2 TO 1 (ONE-HALF TO ONE) TABLET BY MOUTH AT BEDTIME PRN  FOR  SLEEP 30 tablet 0  ? ?No current facility-administered medications for this visit.  ? ? ? ?  Musculoskeletal: ?Strength & Muscle Tone: unable to assess since visit was over the telemedicine. ?Gait & Station: unable to assess since visit was over the telemedicine. ?Patient leans: N/A ? ?Psychiatric Specialty Exam: ?Review of Systems  ?There were no vitals taken for this visit.There is no height or weight on file to calculate BMI.  ?General Appearance: Casual and Disheveled  ?Eye Contact:  Fair  ?Speech:  Clear and Coherent and Normal Rate  ?Volume:  Normal  ?Mood:   "good"  ?Affect:  Appropriate, Congruent, and Restricted  ?Thought Process:  Goal Directed and Linear  ?Orientation:  Full (Time, Place, and Person)  ?Thought Content: Logical   ?Suicidal Thoughts:  No  ?Homicidal Thoughts:  No  ?Memory:  Immediate;   Fair ?Recent;   Fair ?Remote;   Fair  ?Judgement:  Fair  ?Insight:  Fair  ?Psychomotor Activity:  Normal  ?Concentration:  Concentration: Fair and Attention Span: Fair  ?Recall:  Fair  ?Fund of Knowledge: Fair  ?Language: Fair  ?Akathisia:  No  ?  ?AIMS (if indicated): not done  ?Assets:  Communication Skills ?Desire for Improvement ?Financial Resources/Insurance ?Housing ?Leisure Time ?Physical Health ?Social Support ?Transportation ?Vocational/Educational  ?ADL's:  Intact  ?Cognition: WNL  ?Sleep:  Fair  ?  ? ?Screenings: ?PHQ2-9   ? ?Flowsheet Row Video Visit from 04/19/2020 in [facility]  ?PHQ-2 Total Score 2  ?PHQ-9 Total Score 13  ? ?  ? ?[place] ED from 03/07/2020 in [facility]   ?C-SSRS RISK CATEGORY No Risk  ? ?  ? ? ? ?Assessment and Plan:  ?

## 2021-06-14 DIAGNOSIS — F410 Panic disorder [episodic paroxysmal anxiety] without agoraphobia: Secondary | ICD-10-CM | POA: Diagnosis not present

## 2021-07-01 DIAGNOSIS — F410 Panic disorder [episodic paroxysmal anxiety] without agoraphobia: Secondary | ICD-10-CM | POA: Diagnosis not present

## 2021-07-19 ENCOUNTER — Telehealth: Payer: BC Managed Care – PPO | Admitting: Child and Adolescent Psychiatry

## 2021-07-19 ENCOUNTER — Telehealth: Payer: Self-pay | Admitting: Child and Adolescent Psychiatry

## 2021-07-19 NOTE — Telephone Encounter (Signed)
Pt was sent link via text to connect on video for telemedicine encounter for scheduled appointment, and was also followed up with phone call twice. Pt did not connect on the video, and writer left the VM requesting to connect on the video or call back to reschedule appointment if they are not able to connect today for appointment.

## 2021-07-28 DIAGNOSIS — F410 Panic disorder [episodic paroxysmal anxiety] without agoraphobia: Secondary | ICD-10-CM | POA: Diagnosis not present

## 2021-08-03 ENCOUNTER — Telehealth (INDEPENDENT_AMBULATORY_CARE_PROVIDER_SITE_OTHER): Payer: BC Managed Care – PPO | Admitting: Child and Adolescent Psychiatry

## 2021-08-03 DIAGNOSIS — F331 Major depressive disorder, recurrent, moderate: Secondary | ICD-10-CM | POA: Diagnosis not present

## 2021-08-03 DIAGNOSIS — F902 Attention-deficit hyperactivity disorder, combined type: Secondary | ICD-10-CM | POA: Diagnosis not present

## 2021-08-03 DIAGNOSIS — F418 Other specified anxiety disorders: Secondary | ICD-10-CM

## 2021-08-03 DIAGNOSIS — F3341 Major depressive disorder, recurrent, in partial remission: Secondary | ICD-10-CM | POA: Diagnosis not present

## 2021-08-03 MED ORDER — HYDROXYZINE HCL 25 MG PO TABS: 25.0000 mg | ORAL_TABLET | Freq: Three times a day (TID) | ORAL | 1 refills | Status: DC | PRN

## 2021-08-03 MED ORDER — TRAZODONE HCL 50 MG PO TABS: ORAL_TABLET | ORAL | 0 refills | Status: DC

## 2021-08-03 MED ORDER — AMPHETAMINE-DEXTROAMPHET ER 5 MG PO CP24: 5.0000 mg | ORAL_CAPSULE | Freq: Every day | ORAL | 0 refills | Status: DC

## 2021-08-03 MED ORDER — SERTRALINE HCL 100 MG PO TABS: 100.0000 mg | ORAL_TABLET | Freq: Every day | ORAL | 1 refills | Status: DC

## 2021-08-03 MED ORDER — BUPROPION HCL ER (XL) 300 MG PO TB24: 300.0000 mg | ORAL_TABLET | Freq: Every day | ORAL | 1 refills | Status: DC

## 2021-08-03 NOTE — Progress Notes (Signed)
Virtual Visit via Video Note  I connected with Kevin Wilkerson on 08/03/21 at  9:30 AM EDT by a video enabled telemedicine application and verified that I am speaking with the correct person using two identifiers.  Location: Patient: home Provider: office   I discussed the limitations of evaluation and management by telemedicine and the availability of in person appointments. The patient expressed understanding and agreed to proceed.    I discussed the assessment and treatment plan with the patient. The patient was provided an opportunity to ask questions and all were answered. The patient agreed with the plan and demonstrated an understanding of the instructions.   The patient was advised to call back or seek an in-person evaluation if the symptoms worsen or if the condition fails to improve as anticipated.    Orlene Erm, MD    [facility] MD/PA/NP OP Progress Note  08/03/2021 10:02 AM Kevin Wilkerson  MRN:  [id number]  Chief Complaint:   Medication management follow-up for ADHD, anxiety and depression.  HPI:  This is a 21 year old Caucasian male with psychiatric history significant of ADHD, MDD, anxiety was last prescribed Wellbutrin XL 300 mg once a day, Jornay PM 20 mg once a day at 8:00 PM and Zoloft 100 mg once a day and hydroxyzine and trazodone as needed for sleeping difficulties.   Zaven missed his last appointment as he was in [place], made appointment for follow-up today.  He was seen and evaluated over telemedicine encounter.  He was present by himself and was evaluated alone.  He reports that he was in [place] with his girlfriend for 3-1/2 weeks to attend her graduation ceremony and spend time with her.  He reports that he had a really good time with her, and his mood was very good.  He reports that since he came back about 3 days ago, his level down.  He reports that mood improves as they come up with a new plan to meet up again.  He reports that his girlfriend will be  moving to [place] to stay with him in about 3-1/2 weeks and his parents are okay with that.  He reports that they plan to work at a local [place] and saving up money to return to [place].  He reports that overall he has been "good", denies any low lows, denies excessive worries or anxiety.  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  He reports that he continues to struggle with sleep especially when he takes [place] PM, trazodone and hydroxyzine does help.  He reports that he has been spending his time applying to jobs recently.  He denies any SI or HI.  He reports that his appetite has been good, denies problems with energy.  He reports that he has not been focusing well and does not want to do [place] PM because it causes sleep problems.  We discussed that we can switch him back to Adderall XR 5 mg since he is not waking up in the morning and can take it.  He would like to go back on Adderall XR 5 mg once a day.  He reports that he has stayed compliant with rest of his medications.  He also has been seeing his therapist every 2 weeks.  He reports that he has been using marijuana since about last 4 to 5 months, uses about 2 or 3 times a week, has not  used since last 1-1/2 weeks since he does not have money, reports that it makes him feel calm.  His therapist is working with him on this and has also found a therapist who specializes in addiction treatment for him.  Psychoeducation was provided in regards of risks associated with cannabis use.  He was receptive to this.  He will continue with current medications and follow back again in about 6 weeks or earlier if needed.  Visit Diagnosis:    ICD-10-CM   1. Attention deficit hyperactivity disorder (ADHD), combined type  F90.2     2. Recurrent major depressive disorder, in partial remission (HCC)  F33.41     3. Moderate episode of recurrent major depressive disorder (HCC)  F33.1     4.  Other specified anxiety disorders  F41.8         Past Psychiatric History: As mentioned in initial H&P, reviewed today, no change  Past Medical History:  Past Medical History:  Diagnosis Date   ADHD (attention deficit hyperactivity disorder)    Hypertension     Past Surgical History:  Procedure Laterality Date   arm surgery Left     Family Psychiatric History: As mentioned in initial H&P, reviewed today, no change  Family History:  Family History  Problem Relation Age of Onset   Bipolar disorder Mother    Depression Mother    Anxiety disorder Mother    Anxiety disorder Father    Depression Father    Panic disorder Brother     Social History:  Social History   Socioeconomic History   Marital status: Single    Spouse name: Not on file   Number of children: Not on file   Years of education: Not on file   Highest education level: 11th grade  Occupational History   Not on file  Tobacco Use   Smoking status: Never   Smokeless tobacco: Never  Vaping Use   Vaping Use: Never used  Substance and Sexual Activity   Alcohol use: Never   Drug use: Never   Sexual activity: Never  Other Topics Concern   Not on file  Social History Narrative   Not on file   Social Determinants of Health   Financial Resource Strain: Not on file  Food Insecurity: Not on file  Transportation Needs: Not on file  Physical Activity: Not on file  Stress: Not on file  Social Connections: Not on file    Allergies: No Known Allergies  Metabolic Disorder Labs: No results found for: "HGBA1C", "MPG" No results found for: "PROLACTIN" No results found for: "CHOL", "TRIG", "HDL", "CHOLHDL", "VLDL", "LDLCALC" No results found for: "TSH"  Therapeutic Level Labs: No results found for: "LITHIUM" No results found for: "VALPROATE" No results found for: "CBMZ"  Current Medications: Current Outpatient Medications  Medication Sig Dispense Refill   buPROPion (WELLBUTRIN XL) 300 MG 24 hr tablet  Take 1 tablet (300 mg total) by mouth daily. 30 tablet 1   hydrOXYzine (ATARAX) 25 MG tablet Take 1 tablet (25 mg total) by mouth every 8 (eight) hours as needed for anxiety. Can take additional one tablet(25 mg total) at night for sleeping difficulties as needed. 30 tablet 1   sertraline (ZOLOFT) 100 MG tablet Take 1 tablet (100 mg total) by mouth daily. 30 tablet 1   traZODone (DESYREL) 50 MG tablet TAKE 1/2 TO 1 (ONE-HALF TO ONE) TABLET BY MOUTH AT BEDTIME PRN  FOR  SLEEP 30 tablet 0   No current facility-administered medications for  this visit.     Musculoskeletal: Strength & Muscle Tone: unable to assess since visit was over the telemedicine. Gait & Station: unable to assess since visit was over the telemedicine. Patient leans: N/A  Psychiatric Specialty Exam: Review of Systems  There were no vitals taken for this visit.There is no height or weight on file to calculate BMI.  General Appearance: Casual and Disheveled  Eye Contact:  Fair  Speech:  Clear and Coherent and Normal Rate  Volume:  Normal  Mood:   "good..."  Affect:  Appropriate, Congruent, and Restricted  Thought Process:  Goal Directed and Linear  Orientation:  Full (Time, Place, and Person)  Thought Content: Logical   Suicidal Thoughts:  No  Homicidal Thoughts:  No  Memory:  Immediate;   Fair Recent;   Fair Remote;   Fair  Judgement:  Fair  Insight:  Fair  Psychomotor Activity:  Normal  Concentration:  Concentration: Fair and Attention Span: Fair  Recall:  [employer] of Knowledge: Fair  Language: Fair  Akathisia:  No    AIMS (if indicated): not done  Assets:  Communication Skills Desire for Improvement Financial Resources/Insurance Housing Leisure Time Physical Health Social Support Transportation Vocational/Educational  ADL's:  Intact  Cognition: WNL  Sleep:  Fair     Screenings: PHQ2-9    Flowsheet Row Video Visit [facility]/19/2020 in [facility]  PHQ-2 Total Score  2  PHQ-9 Total Score 13      Flowsheet Row ED from 03/07/2020 in [facility] at [place]   C-SSRS RISK CATEGORY No Risk        Assessment and Plan:   21 year old Caucasian boy with 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   He reported history suggestive of significant anxiety, recurrent depression and ADHD on intake in the context of chronic psychosocial stressors and his genetic predisposition.    Reviewed response to current medications on 08/03/21.  He appears to have overall stability with mood and anxiety except intermittent worsening of mood when he comes back to home after visiting his girlfriend.  He also reports that he has not been taking [place] PM because it is impacting his sleep and therefore switching him to Adderall XR 5 mg which he was taking previously.  He continues to see his therapist about every 2 weeks.  He has been intermittently using marijuana and his therapist is referring to her therapist with addiction specialty.  Plan as mentioned below.     Plan:   # Anxiety(chronic, stable) - Continue with Zoloft 100 mg once a day.  - Atrax 25 mg TID PRN for anxiety and QHS PRN for sleeping difficulties.  - Continue Wellbutrin XL 300 mg once a day. - Therapy with Ms. Patty Sprouse   # Depression(recurrent, in partial remission) -Medication as mentioned above.    -Therapy with Ms. Patty Sprouse   # ADHD - Start Adderall XR 5 mg daily.     This note was generated in part or whole with voice recognition software. Voice recognition is usually quite accurate but there are transcription errors that can and very often do occur. I apologize for any typographical errors that were not detected and corrected.   MDM = 2 or more chronic conditions + med management    [name], MD 08/03/2021, 10:02 AM

## 2021-08-19 DIAGNOSIS — F410 Panic disorder [episodic paroxysmal anxiety] without agoraphobia: Secondary | ICD-10-CM | POA: Diagnosis not present

## 2021-08-23 DIAGNOSIS — F410 Panic disorder [episodic paroxysmal anxiety] without agoraphobia: Secondary | ICD-10-CM | POA: Diagnosis not present

## 2021-08-29 DIAGNOSIS — F410 Panic disorder [episodic paroxysmal anxiety] without agoraphobia: Secondary | ICD-10-CM | POA: Diagnosis not present

## 2021-09-06 DIAGNOSIS — F410 Panic disorder [episodic paroxysmal anxiety] without agoraphobia: Secondary | ICD-10-CM | POA: Diagnosis not present

## 2021-09-20 ENCOUNTER — Telehealth (INDEPENDENT_AMBULATORY_CARE_PROVIDER_SITE_OTHER): Payer: BC Managed Care – PPO | Admitting: Child and Adolescent Psychiatry

## 2021-09-20 DIAGNOSIS — F331 Major depressive disorder, recurrent, moderate: Secondary | ICD-10-CM | POA: Diagnosis not present

## 2021-09-20 DIAGNOSIS — F3341 Major depressive disorder, recurrent, in partial remission: Secondary | ICD-10-CM

## 2021-09-20 DIAGNOSIS — F418 Other specified anxiety disorders: Secondary | ICD-10-CM | POA: Diagnosis not present

## 2021-09-20 DIAGNOSIS — F902 Attention-deficit hyperactivity disorder, combined type: Secondary | ICD-10-CM

## 2021-09-20 MED ORDER — AMPHETAMINE-DEXTROAMPHET ER 5 MG PO CP24: 5.0000 mg | ORAL_CAPSULE | Freq: Every day | ORAL | 0 refills | Status: DC

## 2021-09-20 MED ORDER — TRAZODONE HCL 50 MG PO TABS: 50.0000 mg | ORAL_TABLET | Freq: Every day | ORAL | 1 refills | Status: DC

## 2021-09-20 MED ORDER — SERTRALINE HCL 100 MG PO TABS: 100.0000 mg | ORAL_TABLET | Freq: Every day | ORAL | 1 refills | Status: DC

## 2021-09-20 MED ORDER — BUPROPION HCL ER (XL) 300 MG PO TB24: 300.0000 mg | ORAL_TABLET | Freq: Every day | ORAL | 1 refills | Status: DC

## 2021-09-20 NOTE — Progress Notes (Signed)
Virtual Visit via Video Note  I connected with Kevin Wilkerson on 09/20/21 at  9:30 AM EDT by a video enabled telemedicine application and verified that I am speaking with the correct person using two identifiers.  Location: Patient: home Provider: office   I discussed the limitations of evaluation and management by telemedicine and the availability of in person appointments. The patient expressed understanding and agreed to proceed.    I discussed the assessment and treatment plan with the patient. The patient was provided an opportunity to ask questions and all were answered. The patient agreed with the plan and demonstrated an understanding of the instructions.   The patient was advised to call back or seek an in-person evaluation if the symptoms worsen or if the condition fails to improve as anticipated.    [name], MD    [facility] MD/PA/NP OP Progress Note  09/20/2021 10:03 AM Kevin Wilkerson  MRN:  [id number]  Chief Complaint:   Medication management follow-up for ADHD, anxiety and depression.  HPI:  This is a 21 year old Caucasian male with psychiatric history significant of ADHD, MDD, anxiety was last prescribed Wellbutrin XL 300 mg once a day, Adderall XR 5 mg once a day and Zoloft 100 mg once a day and hydroxyzine and trazodone as needed for sleeping difficulties.   Brenon was seen and evaluated over telemedicine encounter for medication management follow-up.  He was present by himself and was evaluated alone.  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  He reports that he really likes it, it pays him well and his parents agreed that if he holds full-time job for at least a month then he can have his girlfriend move in with them.  He reports that he had anxiety on his day 1 however since then he has been adjusting well to his work  and he is able to manage his anxiety.  He reports that his mood is good but he gets sad since he has not seen his girlfriend for the last 2 months.  He reports that he has been talking to his girlfriend on the day he is off and then the second he spends it with his family.  He reports that things are going well with his parents.  He denies any problems with his sleep when he takes trazodone.  He reports that because of his night shifts, he comes back home at 6:00 in the morning, stays up until 12, takes trazodone, sleeps about 8 hours and then when he wakes up he takes his Zoloft, Wellbutrin and Adderall.  He reports that Adderall has been "a life saver".  Because it has helped him stay focused and manage his work.  He denies any SI or HI.  He reports that he has been eating well.  He reports that he has decent amount of energy throughout the day.  He denies any substance abuse except for marijuana which she reports that he has been using it about once a week on the day he is off.  He reports that he is planning to continue to cut down on it.  We discussed to continue with current medications and follow back in about 6 weeks or earlier if needed.  He continues to see his therapist about once every 2 weeks.  Visit Diagnosis:  ICD-10-CM   1. Attention deficit hyperactivity disorder (ADHD), combined type  F90.2 amphetamine-dextroamphetamine (ADDERALL XR) 5 MG 24 hr capsule    amphetamine-dextroamphetamine (ADDERALL XR) 5 MG 24 hr capsule    2. Recurrent major depressive disorder, in partial remission (HCC)  F33.41 buPROPion (WELLBUTRIN XL) 300 MG 24 hr tablet    3. Moderate episode of recurrent major depressive disorder (HCC)  F33.1 sertraline (ZOLOFT) 100 MG tablet    4. Other specified anxiety disorders  F41.8 sertraline (ZOLOFT) 100 MG tablet        Past Psychiatric History: As mentioned in initial H&P, reviewed today, no change  Past Medical History:  Past Medical History:  Diagnosis Date    ADHD (attention deficit hyperactivity disorder)    Hypertension     Past Surgical History:  Procedure Laterality Date   arm surgery Left     Family Psychiatric History: As mentioned in initial H&P, reviewed today, no change  Family History:  Family History  Problem Relation Age of Onset   Bipolar disorder Mother    Depression Mother    Anxiety disorder Mother    Anxiety disorder Father    Depression Father    Panic disorder Brother     Social History:  Social History   Socioeconomic History   Marital status: Single    Spouse name: Not on file   Number of children: Not on file   Years of education: Not on file   Highest education level: 11th grade  Occupational History   Not on file  Tobacco Use   Smoking status: Never   Smokeless tobacco: Never  Vaping Use   Vaping Use: Never used  Substance and Sexual Activity   Alcohol use: Never   Drug use: Never   Sexual activity: Never  Other Topics Concern   Not on file  Social History Narrative   Not on file   Social Determinants of Health   Financial Resource Strain: Not on file  Food Insecurity: Not on file  Transportation Needs: Not on file  Physical Activity: Not on file  Stress: Not on file  Social Connections: Not on file    Allergies: No Known Allergies  Metabolic Disorder Labs: No results found for: "HGBA1C", "MPG" No results found for: "PROLACTIN" No results found for: "CHOL", "TRIG", "HDL", "CHOLHDL", "VLDL", "LDLCALC" No results found for: "TSH"  Therapeutic Level Labs: No results found for: "LITHIUM" No results found for: "VALPROATE" No results found for: "CBMZ"  Current Medications: Current Outpatient Medications  Medication Sig Dispense Refill   amphetamine-dextroamphetamine (ADDERALL XR) 5 MG 24 hr capsule Take 1 capsule (5 mg total) by mouth daily. 30 capsule 0   amphetamine-dextroamphetamine (ADDERALL XR) 5 MG 24 hr capsule Take 1 capsule (5 mg total) by mouth daily. 30 capsule 0    buPROPion (WELLBUTRIN XL) 300 MG 24 hr tablet Take 1 tablet (300 mg total) by mouth daily. 30 tablet 1   hydrOXYzine (ATARAX) 25 MG tablet Take 1 tablet (25 mg total) by mouth every 8 (eight) hours as needed for anxiety. Can take additional one tablet(25 mg total) at night for sleeping difficulties as needed. 30 tablet 1   sertraline (ZOLOFT) 100 MG tablet Take 1 tablet (100 mg total) by mouth daily. 30 tablet 1   traZODone (DESYREL) 50 MG tablet Take 1 tablet (50 mg total) by mouth at bedtime. TAKE 1/2 TO 1 (ONE-HALF TO ONE) TABLET BY MOUTH AT BEDTIME PRN  FOR  SLEEP 30 tablet 1   No  current facility-administered medications for this visit.     Musculoskeletal: Strength & Muscle Tone: unable to assess since visit was over the telemedicine. Gait & Station: unable to assess since visit was over the telemedicine. Patient leans: N/A  Psychiatric Specialty Exam: Review of Systems  There were no vitals taken for this visit.There is no height or weight on file to calculate BMI.  General Appearance: Casual and Disheveled  Eye Contact:  Fair  Speech:  Clear and Coherent and Normal Rate  Volume:  Normal  Mood:   "good..."  Affect:  Appropriate, Congruent, and Restricted  Thought Process:  Goal Directed and Linear  Orientation:  Full (Time, Place, and Person)  Thought Content: Logical   Suicidal Thoughts:  No  Homicidal Thoughts:  No  Memory:  Immediate;   Fair Recent;   Fair Remote;   Fair  Judgement:  Fair  Insight:  Fair  Psychomotor Activity:  Normal  Concentration:  Concentration: Fair and Attention Span: Fair  Recall:  [employer] of Knowledge: Fair  Language: Fair  Akathisia:  No    AIMS (if indicated): not done  Assets:  Communication Skills Desire for Improvement Financial Resources/Insurance Housing Leisure Time Physical Health Social Support Transportation Vocational/Educational  ADL's:  Intact  Cognition: WNL  Sleep:  Fair     Screenings: PHQ2-9    Flowsheet  Row Video Visit [facility]19/[facility]  PHQ-2 Total Score 2  PHQ-9 Total Score 13      Flowsheet Row ED from 03/07/2020 in [facility] [place]   C-SSRS RISK CATEGORY No Risk        Assessment and Plan:   21 year old Caucasian boy with 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   He reported history suggestive of significant anxiety, recurrent depression and ADHD on intake in the context of chronic psychosocial stressors and his genetic predisposition.    Reviewed response to current medications on 09/20/2021.  He appears to have continued stability with his mood and anxiety, he has started working full-time and that seems to have helped both financially and with his mental health.  We discussed to continue with current medications and continue follow-up with his therapist every 2 weeks.    Plan as mentioned below.     Plan:   # Anxiety(chronic, stable) - Continue with Zoloft 100 mg once a day.  - Atrax 25 mg TID PRN for anxiety and QHS PRN for sleeping difficulties.  - Continue Wellbutrin XL 300 mg once a day. - Therapy with Ms. Patty Sprouse   # Depression(recurrent, in partial remission) -Medication as mentioned above.    -Therapy with Ms. Patty Sprouse   # ADHD - Continue with Adderall XR 5 mg daily.    # Sleep - Continue with Trazodone 50 mg once a day for sleep   This note was generated in part or whole with voice recognition software. Voice recognition is usually quite accurate but there are transcription errors that can and very often do occur. I apologize for any typographical errors that were not detected and corrected.   MDM = 2 or more chronic conditions + med management    [name], MD 09/20/2021, 10:03 AM

## 2021-10-04 DIAGNOSIS — F410 Panic disorder [episodic paroxysmal anxiety] without agoraphobia: Secondary | ICD-10-CM | POA: Diagnosis not present

## 2021-10-25 DIAGNOSIS — F410 Panic disorder [episodic paroxysmal anxiety] without agoraphobia: Secondary | ICD-10-CM | POA: Diagnosis not present

## 2021-10-31 DIAGNOSIS — F410 Panic disorder [episodic paroxysmal anxiety] without agoraphobia: Secondary | ICD-10-CM | POA: Diagnosis not present

## 2021-11-11 ENCOUNTER — Telehealth (INDEPENDENT_AMBULATORY_CARE_PROVIDER_SITE_OTHER): Payer: BC Managed Care – PPO | Admitting: Child and Adolescent Psychiatry

## 2021-11-11 DIAGNOSIS — F3341 Major depressive disorder, recurrent, in partial remission: Secondary | ICD-10-CM | POA: Diagnosis not present

## 2021-11-11 DIAGNOSIS — F331 Major depressive disorder, recurrent, moderate: Secondary | ICD-10-CM

## 2021-11-11 DIAGNOSIS — F902 Attention-deficit hyperactivity disorder, combined type: Secondary | ICD-10-CM | POA: Diagnosis not present

## 2021-11-11 DIAGNOSIS — F418 Other specified anxiety disorders: Secondary | ICD-10-CM

## 2021-11-11 MED ORDER — TRAZODONE HCL 50 MG PO TABS
50.0000 mg | ORAL_TABLET | Freq: Every day | ORAL | 1 refills | Status: DC
Start: 2021-11-11 — End: 2022-07-10

## 2021-11-11 MED ORDER — BUPROPION HCL ER (XL) 300 MG PO TB24: 300.0000 mg | ORAL_TABLET | Freq: Every day | ORAL | 1 refills | Status: DC

## 2021-11-11 MED ORDER — AMPHETAMINE-DEXTROAMPHET ER 5 MG PO CP24: ORAL_CAPSULE | ORAL | 0 refills | Status: DC

## 2021-11-11 MED ORDER — HYDROXYZINE HCL 25 MG PO TABS: 25.0000 mg | ORAL_TABLET | Freq: Every day | ORAL | 1 refills | Status: DC

## 2021-11-11 MED ORDER — SERTRALINE HCL 100 MG PO TABS: 100.0000 mg | ORAL_TABLET | Freq: Every day | ORAL | 1 refills | Status: DC

## 2021-11-11 NOTE — Progress Notes (Signed)
Virtual Visit via Video Note  I connected with Kevin Wilkerson on 11/11/21 at 10:00 AM EDT by a video enabled telemedicine application and verified that I am speaking with the correct person using two identifiers.  Location: Patient: home Provider: office   I discussed the limitations of evaluation and management by telemedicine and the availability of in person appointments. The patient expressed understanding and agreed to proceed.    I discussed the assessment and treatment plan with the patient. The patient was provided an opportunity to ask questions and all were answered. The patient agreed with the plan and demonstrated an understanding of the instructions.   The patient was advised to call back or seek an in-person evaluation if the symptoms worsen or if the condition fails to improve as anticipated.    Orlene Erm, MD    [facility] MD/PA/NP OP Progress Note  11/11/2021 10:17 AM Kevin Wilkerson  MRN:  [id number]  Chief Complaint:   Medication management follow-up for ADHD, anxiety and depression.  HPI:  This is a 21 year old Caucasian male with psychiatric history significant of ADHD, MDD, anxiety was last prescribed Wellbutrin XL 300 mg once a day, Adderall XR 5 mg once a day and Zoloft 100 mg once a day and hydroxyzine and trazodone as needed for sleeping difficulties.   Bracen was seen and evaluated over telemedicine encounter for medication management follow-up.  He was present by himself and was evaluated alone.  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  He reports that his girlfriend has moved from [place] and has been living with him since last 1 month.  He reports that she has adjusted well into the household.  He denies any new concerns for today's appointment except he feels that his Adderall stops working after 5 to 6 hours into his shift.  We discussed that we can try taking it 2 times a day to help him  through the entire shift.  He verbalized understanding.  He reports that he still feels depressed but his mood is at 7 out of 10, 10 being the best mood on most days.  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  Anxiety is around 3 out of 10, 10 being most anxious.  He reports that he still struggles with lack of motivation and energy but overall pushes himself to get to his work.  Sleeps well, eats well.  And has been compliant with his medication and sees his therapist every week.  We discussed to continue with current treatment plan and follow back again in about 2 months or earlier if needed.  He verbalized understanding and agreed with this plan.   Visit Diagnosis:    ICD-10-CM   1. Other specified anxiety disorders  F41.8 hydrOXYzine (ATARAX) 25 MG tablet    sertraline (ZOLOFT) 100 MG tablet    2. Recurrent major depressive disorder, in partial remission (HCC)  F33.41 buPROPion (WELLBUTRIN XL) 300 MG 24 hr tablet    3. Attention deficit hyperactivity disorder (ADHD), combined type  F90.2 amphetamine-dextroamphetamine (ADDERALL XR) 5 MG 24 hr capsule    4. Moderate episode of recurrent major depressive disorder (HCC)  F33.1 sertraline (ZOLOFT) 100 MG tablet        Past Psychiatric History: As mentioned in initial H&P, reviewed today, no change  Past Medical History:  Past Medical History:  Diagnosis Date   ADHD (attention deficit hyperactivity disorder)  Hypertension     Past Surgical History:  Procedure Laterality Date   arm surgery Left     Family Psychiatric History: As mentioned in initial H&P, reviewed today, no change  Family History:  Family History  Problem Relation Age of Onset   Bipolar disorder Mother    Depression Mother    Anxiety disorder Mother    Anxiety disorder Father    Depression Father    Panic disorder Brother     Social History:  Social History   Socioeconomic History   Marital status: Single    Spouse name: Not on file   Number of  children: Not on file   Years of education: Not on file   Highest education level: 11th grade  Occupational History   Not on file  Tobacco Use   Smoking status: Never   Smokeless tobacco: Never  Vaping Use   Vaping Use: Never used  Substance and Sexual Activity   Alcohol use: Never   Drug use: Never   Sexual activity: Never  Other Topics Concern   Not on file  Social History Narrative   Not on file   Social Determinants of Health   Financial Resource Strain: Not on file  Food Insecurity: Not on file  Transportation Needs: Not on file  Physical Activity: Not on file  Stress: Not on file  Social Connections: Not on file    Allergies: No Known Allergies  Metabolic Disorder Labs: No results found for: "HGBA1C", "MPG" No results found for: "PROLACTIN" No results found for: "CHOL", "TRIG", "HDL", "CHOLHDL", "VLDL", "LDLCALC" No results found for: "TSH"  Therapeutic Level Labs: No results found for: "LITHIUM" No results found for: "VALPROATE" No results found for: "CBMZ"  Current Medications: Current Outpatient Medications  Medication Sig Dispense Refill   amphetamine-dextroamphetamine (ADDERALL XR) 5 MG 24 hr capsule Take 1 capsule (5 mg total) by mouth daily. 30 capsule 0   amphetamine-dextroamphetamine (ADDERALL XR) 5 MG 24 hr capsule Take 1 tablet (5 mg total) by mouth daily before work and 5-6 hours after the first dose. 60 capsule 0   buPROPion (WELLBUTRIN XL) 300 MG 24 hr tablet Take 1 tablet (300 mg total) by mouth daily. 30 tablet 1   hydrOXYzine (ATARAX) 25 MG tablet Take 1 tablet (25 mg total) by mouth at bedtime. Can take additional one tablet(25 mg total) at night for sleeping difficulties as needed. 30 tablet 1   sertraline (ZOLOFT) 100 MG tablet Take 1 tablet (100 mg total) by mouth daily. 30 tablet 1   traZODone (DESYREL) 50 MG tablet Take 1 tablet (50 mg total) by mouth at bedtime. TAKE 1/2 TO 1 (ONE-HALF TO ONE) TABLET BY MOUTH AT BEDTIME PRN  FOR  SLEEP  30 tablet 1   No current facility-administered medications for this visit.     Musculoskeletal: Strength & Muscle Tone: unable to assess since visit was over the telemedicine. Gait & Station: unable to assess since visit was over the telemedicine. Patient leans: N/A  Psychiatric Specialty Exam: Review of Systems  There were no vitals taken for this visit.There is no height or weight on file to calculate BMI.  General Appearance: Casual and Disheveled  Eye Contact:  Fair  Speech:  Clear and Coherent and Normal Rate  Volume:  Normal  Mood:   "good..."  Affect:  Appropriate, Congruent, and Restricted  Thought Process:  Goal Directed and Linear  Orientation:  Full (Time, Place, and Person)  Thought Content: Logical   Suicidal Thoughts:  No  Homicidal Thoughts:  No  Memory:  Immediate;   Fair Recent;   Fair Remote;   Fair  Judgement:  Fair  Insight:  Fair  Psychomotor Activity:  Normal  Concentration:  Concentration: Fair and Attention Span: Fair  Recall:  [employer] of Knowledge: Fair  Language: Fair  Akathisia:  No    AIMS (if indicated): not done  Assets:  Communication Skills Desire for Improvement Financial Resources/Insurance Housing Leisure Time Physical Health Social Support Transportation Vocational/Educational  ADL's:  Intact  Cognition: WNL  Sleep:  Fair     Screenings: PHQ2-9    Flowsheet Row Video Visit [facility]/19/2020 in [facility]  PHQ-2 Total Score 2  PHQ-9 Total Score 13      Flowsheet Row ED from 03/07/2020 in [facility] [place]   C-SSRS RISK CATEGORY No Risk        Assessment and Plan:   21 year old Caucasian boy with 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   He reported history suggestive of significant anxiety, recurrent depression and ADHD on intake in the context of chronic psychosocial stressors and  his genetic predisposition.    Reviewed response to current medications on 11/11/21.  He appears to have continued stability with his mood and anxiety, he has started working full-time and that seems to have helped both financially and with his mental health.  Adderall seems to wear off around 5-6 hours mark, increasing to twice daily   Plan as mentioned below.     Plan:   # Anxiety(chronic, stable) - Continue with Zoloft 100 mg once a day.  - Atrax 25 mg TID PRN for anxiety and QHS PRN for sleeping difficulties.  - Continue Wellbutrin XL 300 mg once a day. - Therapy with Ms. Patty Sprouse   # Depression(recurrent, in partial remission) -Medication as mentioned above.    -Therapy with Ms. Patty Sprouse   # ADHD - Change with Adderall XR 5 mg twice daily.    # Sleep - Continue with Trazodone 50 mg once a day for sleep   This note was generated in part or whole with voice recognition software. Voice recognition is usually quite accurate but there are transcription errors that can and very often do occur. I apologize for any typographical errors that were not detected and corrected.   MDM = 2 or more chronic conditions + med management    [name], MD 11/11/2021, 10:17 AM

## 2021-11-23 DIAGNOSIS — F410 Panic disorder [episodic paroxysmal anxiety] without agoraphobia: Secondary | ICD-10-CM | POA: Diagnosis not present

## 2021-11-25 DIAGNOSIS — R6889 Other general symptoms and signs: Secondary | ICD-10-CM | POA: Diagnosis not present

## 2021-11-25 DIAGNOSIS — R509 Fever, unspecified: Secondary | ICD-10-CM | POA: Diagnosis not present

## 2021-11-25 DIAGNOSIS — R058 Other specified cough: Secondary | ICD-10-CM | POA: Diagnosis not present

## 2022-01-04 DIAGNOSIS — Z130 Encounter for screening for diseases of the blood and blood-forming organs and certain disorders involving the immune mechanism: Secondary | ICD-10-CM | POA: Diagnosis not present

## 2022-01-04 DIAGNOSIS — Z0000 Encounter for general adult medical examination without abnormal findings: Secondary | ICD-10-CM | POA: Diagnosis not present

## 2022-01-04 DIAGNOSIS — Z8679 Personal history of other diseases of the circulatory system: Secondary | ICD-10-CM | POA: Diagnosis not present

## 2022-01-04 DIAGNOSIS — Z23 Encounter for immunization: Secondary | ICD-10-CM | POA: Diagnosis not present

## 2022-01-04 DIAGNOSIS — Z1329 Encounter for screening for other suspected endocrine disorder: Secondary | ICD-10-CM | POA: Diagnosis not present

## 2022-01-04 DIAGNOSIS — F909 Attention-deficit hyperactivity disorder, unspecified type: Secondary | ICD-10-CM | POA: Diagnosis not present

## 2022-01-04 DIAGNOSIS — Z131 Encounter for screening for diabetes mellitus: Secondary | ICD-10-CM | POA: Diagnosis not present

## 2022-01-04 DIAGNOSIS — F418 Other specified anxiety disorders: Secondary | ICD-10-CM | POA: Diagnosis not present

## 2022-01-04 DIAGNOSIS — Z1321 Encounter for screening for nutritional disorder: Secondary | ICD-10-CM | POA: Diagnosis not present

## 2022-01-04 DIAGNOSIS — Z1159 Encounter for screening for other viral diseases: Secondary | ICD-10-CM | POA: Diagnosis not present

## 2022-01-04 DIAGNOSIS — Z13220 Encounter for screening for lipoid disorders: Secondary | ICD-10-CM | POA: Diagnosis not present

## 2022-01-06 ENCOUNTER — Telehealth: Payer: BC Managed Care – PPO | Admitting: Child and Adolescent Psychiatry

## 2022-01-06 ENCOUNTER — Telehealth: Payer: Self-pay | Admitting: Child and Adolescent Psychiatry

## 2022-01-06 LAB — HM HEPATITIS C SCREENING LAB: HM Hepatitis Screen: NEGATIVE

## 2022-01-06 NOTE — Telephone Encounter (Signed)
Pt was sent link via text and email to connect on video for telemedicine encounter for scheduled appointment, and was also followed up with phone call. Pt did not connect on the video, and writer left the VM requesting to connect on the video or call back to reschedule appointment if they are not able to connect today for appointment.   

## 2022-01-16 DIAGNOSIS — E559 Vitamin D deficiency, unspecified: Secondary | ICD-10-CM | POA: Insufficient documentation

## 2022-02-15 DIAGNOSIS — U071 COVID-19: Secondary | ICD-10-CM | POA: Diagnosis not present

## 2022-07-01 ENCOUNTER — Other Ambulatory Visit: Payer: Self-pay | Admitting: Child and Adolescent Psychiatry

## 2022-07-01 DIAGNOSIS — F331 Major depressive disorder, recurrent, moderate: Secondary | ICD-10-CM

## 2022-07-01 DIAGNOSIS — F3341 Major depressive disorder, recurrent, in partial remission: Secondary | ICD-10-CM

## 2022-07-01 DIAGNOSIS — F418 Other specified anxiety disorders: Secondary | ICD-10-CM

## 2022-07-10 ENCOUNTER — Telehealth (INDEPENDENT_AMBULATORY_CARE_PROVIDER_SITE_OTHER): Payer: BC Managed Care – PPO | Admitting: Child and Adolescent Psychiatry

## 2022-07-10 DIAGNOSIS — F418 Other specified anxiety disorders: Secondary | ICD-10-CM

## 2022-07-10 DIAGNOSIS — F331 Major depressive disorder, recurrent, moderate: Secondary | ICD-10-CM

## 2022-07-10 DIAGNOSIS — F3341 Major depressive disorder, recurrent, in partial remission: Secondary | ICD-10-CM | POA: Diagnosis not present

## 2022-07-10 MED ORDER — BUPROPION HCL ER (XL) 300 MG PO TB24: 300.0000 mg | ORAL_TABLET | Freq: Every day | ORAL | 1 refills | Status: DC

## 2022-07-10 MED ORDER — TRAZODONE HCL 50 MG PO TABS: 50.0000 mg | ORAL_TABLET | Freq: Every day | ORAL | 1 refills | Status: DC

## 2022-07-10 MED ORDER — HYDROXYZINE HCL 25 MG PO TABS
25.0000 mg | ORAL_TABLET | Freq: Every day | ORAL | 1 refills | Status: DC
Start: 2022-07-10 — End: 2022-10-12

## 2022-07-10 MED ORDER — SERTRALINE HCL 100 MG PO TABS: 100.0000 mg | ORAL_TABLET | Freq: Every day | ORAL | 1 refills | Status: DC

## 2022-07-10 NOTE — Progress Notes (Signed)
Virtual Visit via Video Note  I connected with Kevin Wilkerson on 07/10/22 at 10:30 AM EDT by a video enabled telemedicine application and verified that I am speaking with the correct person using two identifiers.  Location: Patient: home Provider: office   I discussed the limitations of evaluation and management by telemedicine and the availability of in person appointments. The patient expressed understanding and agreed to proceed.    I discussed the assessment and treatment plan with the patient. The patient was provided an opportunity to ask questions and all were answered. The patient agreed with the plan and demonstrated an understanding of the instructions.   The patient was advised to call back or seek an in-person evaluation if the symptoms worsen or if the condition fails to improve as anticipated.    Kevin Smalling, MD    [facility] MD/PA/NP OP Progress Note  07/10/2022 2:57 PM [name]  MRN:  [id number]  Chief Complaint:   Medication management follow-up for depression, anxiety and ADHD.  HPI:  This is a 22 year old Caucasian male with psychiatric history significant of ADHD, MDD, anxiety was last prescribed Wellbutrin XL 300 mg once a day, Adderall XR 5 mg once a day and Zoloft 100 mg once a day and hydroxyzine and trazodone as needed for sleeping difficulties.  He presents for follow-up after about 8 months.  He reports that he spent the last few months traveling to different states along with her fianc and therefore he could not see me earlier.  He says that he made this appointment to get the refills on the medications.  He also reports that he has not been taking his medications consistently over the past few months and recently has noted himself being more depressed.  He reports that his mother and his fianc both have been telling him to take his medications consistently because of his depression.  When asked to describe his depression, he says that he feels anhedonic,  does not want to do anything, wants to stay at home and lay in the bed, things get boring if he starts doing things.  He says that he is still sleeping well and does not have any problems with appetite but without the trazodone or hydroxyzine he cannot sleep.  He says that recently he has been stressed because he needs to find a job and he has been interviewing every day to different places and the uncertainty of the work is making him more anxious.  He denies any SI or HI.  He says that he is not using any nicotine but he smokes marijuana about twice a week.  He denies any alcohol use or other drug abuse.  He says that he restarted taking Zoloft 100 mg and Wellbutrin XL 300 mg once a day since last 1 week, has not had any side effects associated with it and has been taking it consistently.  We discussed to continue with the medications, improve the adherence and reassess the need for further adjustment once he is consistently taking his medications.  He verbalized understanding.  He does not prefer to take Adderall for ADHD, discussed that once he is back in work and if needed we can restart it in the future.  He is also asked to call his therapist and make appointment.   Visit Diagnosis:    ICD-10-CM   1. Recurrent major depressive disorder, in partial remission (HCC)  F33.41 buPROPion (WELLBUTRIN XL) 300 MG 24 hr tablet    2. Other  specified anxiety disorders  F41.8 sertraline (ZOLOFT) 100 MG tablet    hydrOXYzine (ATARAX) 25 MG tablet    3. Moderate episode of recurrent major depressive disorder (HCC)  F33.1 sertraline (ZOLOFT) 100 MG tablet        Past Psychiatric History: As mentioned in initial H&P, reviewed today, no change  Past Medical History:  Past Medical History:  Diagnosis Date   ADHD (attention deficit hyperactivity disorder)    Hypertension     Past Surgical History:  Procedure Laterality Date   arm surgery Left     Family Psychiatric History: As mentioned in initial  H&P, reviewed today, no change  Family History:  Family History  Problem Relation Age of Onset   Bipolar disorder Mother    Depression Mother    Anxiety disorder Mother    Anxiety disorder Father    Depression Father    Panic disorder Brother     Social History:  Social History   Socioeconomic History   Marital status: Single    Spouse name: Not on file   Number of children: Not on file   Years of education: Not on file   Highest education level: 11th grade  Occupational History   Not on file  Tobacco Use   Smoking status: Never   Smokeless tobacco: Never  Vaping Use   Vaping Use: Never used  Substance and Sexual Activity   Alcohol use: Never   Drug use: Never   Sexual activity: Never  Other Topics Concern   Not on file  Social History Narrative   Not on file   Social Determinants of Health   Financial Resource Strain: Not on file  Food Insecurity: Not on file  Transportation Needs: Not on file  Physical Activity: Not on file  Stress: Not on file  Social Connections: Not on file    Allergies: No Known Allergies  Metabolic Disorder Labs: No results found for: "HGBA1C", "MPG" No results found for: "PROLACTIN" No results found for: "CHOL", "TRIG", "HDL", "CHOLHDL", "VLDL", "LDLCALC" No results found for: "TSH"  Therapeutic Level Labs: No results found for: "LITHIUM" No results found for: "VALPROATE" No results found for: "CBMZ"  Current Medications: Current Outpatient Medications  Medication Sig Dispense Refill   amphetamine-dextroamphetamine (ADDERALL XR) 5 MG 24 hr capsule Take 1 capsule (5 mg total) by mouth daily. 30 capsule 0   amphetamine-dextroamphetamine (ADDERALL XR) 5 MG 24 hr capsule Take 1 tablet (5 mg total) by mouth daily before work and 5-6 hours after the first dose. 60 capsule 0   buPROPion (WELLBUTRIN XL) 300 MG 24 hr tablet Take 1 tablet (300 mg total) by mouth daily. 30 tablet 1   hydrOXYzine (ATARAX) 25 MG tablet Take 1 tablet (25  mg total) by mouth at bedtime. Can take additional one tablet(25 mg total) at night for sleeping difficulties as needed. 30 tablet 1   sertraline (ZOLOFT) 100 MG tablet Take 1 tablet (100 mg total) by mouth daily. 30 tablet 1   traZODone (DESYREL) 50 MG tablet Take 1 tablet (50 mg total) by mouth at bedtime. TAKE 1/2 TO 1 (ONE-HALF TO ONE) TABLET BY MOUTH AT BEDTIME PRN  FOR  SLEEP 30 tablet 1   No current facility-administered medications for this visit.     Musculoskeletal: Strength & Muscle Tone: unable to assess since visit was over the telemedicine. Gait & Station: unable to assess since visit was over the telemedicine. Patient leans: N/A  Psychiatric Specialty Exam: Review of Systems  There  were no vitals taken for this visit.There is no height or weight on file to calculate BMI.  General Appearance: Casual and Disheveled  Eye Contact:  Fair  Speech:  Clear and Coherent and Normal Rate  Volume:  Normal  Mood:   "good..."  Affect:  Appropriate, Congruent, and Restricted  Thought Process:  Goal Directed and Linear  Orientation:  Full (Time, Place, and Person)  Thought Content: Logical   Suicidal Thoughts:  No  Homicidal Thoughts:  No  Memory:  Immediate;   Fair Recent;   Fair Remote;   Fair  Judgement:  Fair  Insight:  Fair  Psychomotor Activity:  Normal  Concentration:  Concentration: Fair and Attention Span: Fair  Recall:  [employer] of Knowledge: Fair  Language: Fair  Akathisia:  No    AIMS (if indicated): not done  Assets:  Communication Skills Desire for Improvement Financial Resources/Insurance Housing Leisure Time Physical Health Social Support Transportation Vocational/Educational  ADL's:  Intact  Cognition: WNL  Sleep:  Fair     Screenings: PHQ2-9    Flowsheet Row [facility] in [facility]  PHQ-2 Total Score 2  PHQ-9 Total Score 13      Flowsheet Row ED from 03/07/2020 in [facility] at [place]   C-SSRS RISK CATEGORY No Risk        Assessment and Plan:   22 year old Caucasian boy with 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   He reported history suggestive of significant anxiety, recurrent depression and ADHD on intake in the context of chronic psychosocial stressors and his genetic predisposition.    Reviewed response to current medications on 07/10/22.  He presented for follow-up after 8 months, reports worsening of depression and anxiety in the context of medication nonadherence as well as psychosocial stressors.  Recommended to stay consistent with his medications as they have been previously helpful and reach out to his therapist to schedule appointment.    Plan as mentioned below.     Plan:   # Anxiety(chronic, stable) - Continue with Zoloft 100 mg once a day.  - Atrax 25 mg TID PRN for anxiety and QHS PRN for sleeping difficulties.  - Continue Wellbutrin XL 300 mg once a day. - Therapy with Ms. Patty Sprouse   # Depression(recurrent, in partial remission) -Medication as mentioned above.    -Therapy with Ms. Patty Sprouse   # ADHD - Consider restarting Adderall XR if needed in future.   # Sleep - Continue with Trazodone 50 mg once a day for sleep   This note was generated in part or whole with voice recognition software. Voice recognition is usually quite accurate but there are transcription errors that can and very often do occur. I apologize for any typographical errors that were not detected and corrected.   MDM = 2 or more chronic conditions + med management    Kevin Smalling, MD 07/10/2022, 2:57 PM

## 2022-08-16 ENCOUNTER — Telehealth (INDEPENDENT_AMBULATORY_CARE_PROVIDER_SITE_OTHER): Payer: BC Managed Care – PPO | Admitting: Child and Adolescent Psychiatry

## 2022-08-16 DIAGNOSIS — F418 Other specified anxiety disorders: Secondary | ICD-10-CM

## 2022-08-16 DIAGNOSIS — F3341 Major depressive disorder, recurrent, in partial remission: Secondary | ICD-10-CM | POA: Diagnosis not present

## 2022-08-16 MED ORDER — BUPROPION HCL ER (XL) 300 MG PO TB24: 300.0000 mg | ORAL_TABLET | Freq: Every day | ORAL | 1 refills | Status: DC

## 2022-08-16 MED ORDER — TRAZODONE HCL 50 MG PO TABS: 50.0000 mg | ORAL_TABLET | Freq: Every day | ORAL | 1 refills | Status: DC

## 2022-08-16 MED ORDER — SERTRALINE HCL 100 MG PO TABS: 100.0000 mg | ORAL_TABLET | Freq: Every day | ORAL | 1 refills | Status: DC

## 2022-08-16 NOTE — Progress Notes (Signed)
Virtual Visit via Video Note  I connected with Kevin Wilkerson on 08/16/22 at 10:30 AM EDT by a video enabled telemedicine application and verified that I am speaking with the correct person using two identifiers.  Location: Patient: home Provider: office   I discussed the limitations of evaluation and management by telemedicine and the availability of in person appointments. The patient expressed understanding and agreed to proceed.    I discussed the assessment and treatment plan with the patient. The patient was provided an opportunity to ask questions and all were answered. The patient agreed with the plan and demonstrated an understanding of the instructions.   The patient was advised to call back or seek an in-person evaluation if the symptoms worsen or if the condition fails to improve as anticipated.    [name], MD    [facility] MD/PA/NP OP Progress Note  08/16/2022 10:55 AM Kevin Wilkerson  MRN:  [id number]  Chief Complaint:   Medication management follow-up for depression, anxiety and ADHD.  HPI:  This is a 22 year old Caucasian male with psychiatric history significant of ADHD, MDD, anxiety was last prescribed Wellbutrin XL 300 mg once a day, Adderall XR 5 mg once a day and Zoloft 100 mg once a day and hydroxyzine and trazodone as needed for sleeping difficulties.  He presents for follow-up after about 1 month.  He reports that he has been doing much better as compared to last appointment.  He says that he has been feeling more motivated, not sleeping excessively as he did before, sleeps about 8 hours and sleep has been restful.  He also says that he is finding more interest in the things that he enjoys.  He says that he has been spending time reading books, playing some video games and looking for jobs.  He says that finding job has been stressful but he is working on it.  He says that his anxiety is overall much better, rates it around 4 out of 10, 10 being most anxious and  his mood is at 7 out of 10, 10 being the best mood.  He denies any SI or HI.  He says that he has been eating well, things are going well at home with his girlfriend and his parents.  He says that he has been very compliant with his medications, tracks his medication taking with the phone up and since being more compliant with medication he has noticed improvement.  We discussed to continue with current medications because of the improvement and follow-up again in about 6 weeks or earlier if needed.   Visit Diagnosis:    ICD-10-CM   1. Recurrent major depressive disorder, in partial remission (HCC)  F33.41 buPROPion (WELLBUTRIN XL) 300 MG 24 hr tablet    2. Other specified anxiety disorders  F41.8 sertraline (ZOLOFT) 100 MG tablet        Past Psychiatric History: As mentioned in initial H&P, reviewed today, no change  Past Medical History:  Past Medical History:  Diagnosis Date   ADHD (attention deficit hyperactivity disorder)    Hypertension     Past Surgical History:  Procedure Laterality Date   arm surgery Left     Family Psychiatric History: As mentioned in initial H&P, reviewed today, no change  Family History:  Family History  Problem Relation Age of Onset   Bipolar disorder Mother    Depression Mother    Anxiety disorder Mother    Anxiety disorder Father    Depression Father  Panic disorder Brother     Social History:  Social History   Socioeconomic History   Marital status: Single    Spouse name: Not on file   Number of children: Not on file   Years of education: Not on file   Highest education level: 11th grade  Occupational History   Not on file  Tobacco Use   Smoking status: Never   Smokeless tobacco: Never  Vaping Use   Vaping Use: Never used  Substance and Sexual Activity   Alcohol use: Never   Drug use: Never   Sexual activity: Never  Other Topics Concern   Not on file  Social History Narrative   Not on file   Social Determinants of  Health   Financial Resource Strain: Not on file  Food Insecurity: Not on file  Transportation Needs: Not on file  Physical Activity: Not on file  Stress: Not on file  Social Connections: Not on file    Allergies: No Known Allergies  Metabolic Disorder Labs: No results found for: "HGBA1C", "MPG" No results found for: "PROLACTIN" No results found for: "CHOL", "TRIG", "HDL", "CHOLHDL", "VLDL", "LDLCALC" No results found for: "TSH"  Therapeutic Level Labs: No results found for: "LITHIUM" No results found for: "VALPROATE" No results found for: "CBMZ"  Current Medications: Current Outpatient Medications  Medication Sig Dispense Refill   buPROPion (WELLBUTRIN XL) 300 MG 24 hr tablet Take 1 tablet (300 mg total) by mouth daily. 30 tablet 1   hydrOXYzine (ATARAX) 25 MG tablet Take 1 tablet (25 mg total) by mouth at bedtime. Can take additional one tablet(25 mg total) at night for sleeping difficulties as needed. 30 tablet 1   sertraline (ZOLOFT) 100 MG tablet Take 1 tablet (100 mg total) by mouth daily. 30 tablet 1   traZODone (DESYREL) 50 MG tablet Take 1 tablet (50 mg total) by mouth at bedtime. TAKE 1/2 TO 1 (ONE-HALF TO ONE) TABLET BY MOUTH AT BEDTIME PRN  FOR  SLEEP 30 tablet 1   No current facility-administered medications for this visit.     Musculoskeletal: Strength & Muscle Tone: unable to assess since visit was over the telemedicine. Gait & Station: unable to assess since visit was over the telemedicine. Patient leans: N/A  Psychiatric Specialty Exam: Review of Systems  There were no vitals taken for this visit.There is no height or weight on file to calculate BMI.  General Appearance: Casual and Disheveled  Eye Contact:  Fair  Speech:  Clear and Coherent and Normal Rate  Volume:  Normal  Mood:   "good..."  Affect:  Appropriate, Congruent, and Restricted  Thought Process:  Goal Directed and Linear  Orientation:  Full (Time, Place, and Person)  Thought Content:  Logical   Suicidal Thoughts:  No  Homicidal Thoughts:  No  Memory:  Immediate;   Fair Recent;   Fair Remote;   Fair  Judgement:  Fair  Insight:  Fair  Psychomotor Activity:  Normal  Concentration:  Concentration: Fair and Attention Span: Fair  Recall:  [employer] of Knowledge: Fair  Language: Fair  Akathisia:  No    AIMS (if indicated): not done  Assets:  Communication Skills Desire for Improvement Financial Resources/Insurance Housing Leisure Time Physical Health Social Support Transportation Vocational/Educational  ADL's:  Intact  Cognition: WNL  Sleep:  Fair     Screenings: PHQ2-9    Flowsheet Row Video Visit from 04/19/2020 in [facility]  PHQ-2 Total Score 2  PHQ-9 Total  Score 13      Flowsheet Row ED from 03/07/2020 in [facility]   C-SSRS RISK CATEGORY No Risk        Assessment and Plan:   22 year old Caucasian boy with 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   He reported history suggestive of significant anxiety, recurrent depression and ADHD on intake in the context of chronic psychosocial stressors and his genetic predisposition.    Reviewed response to current medications on 08/16/22.  He appears to have achieved remission in depressive symptoms and stability with anxiety since he has been more compliant with his medications.  Continues to have some psychosocial stressors mainly financial due to lack of job right now but he seems to be managing it well.  We discussed to continue with current medications for now.  He has not been able to see his therapist because of the insurance reasons and he was recommended to look for a different therapist if his previous therapist is not covered by his insurance.  He verbalized understanding.    Plan as mentioned below.     Plan:   # Anxiety(chronic, stable) -  Continue with Zoloft 100 mg once a day.  - Atrax 25 mg TID PRN for anxiety and QHS PRN for sleeping difficulties.  - Continue Wellbutrin XL 300 mg once a day. - Recommended to re-establish therapy   # Depression(recurrent, in partial remission) - Medication as mentioned above.    - Recommended to re-establish therapy   # ADHD - Consider restarting Adderall XR if needed in future.   # Sleep - Continue with Trazodone 50 mg once a day for sleep   This note was generated in part or whole with voice recognition software. Voice recognition is usually quite accurate but there are transcription errors that can and very often do occur. I apologize for any typographical errors that were not detected and corrected.   MDM = 2 or more chronic conditions + med management    [name], MD 08/16/2022, 10:55 AM

## 2022-09-29 ENCOUNTER — Telehealth: Payer: BC Managed Care – PPO | Admitting: Child and Adolescent Psychiatry

## 2022-10-12 ENCOUNTER — Telehealth (INDEPENDENT_AMBULATORY_CARE_PROVIDER_SITE_OTHER): Payer: BC Managed Care – PPO | Admitting: Child and Adolescent Psychiatry

## 2022-10-12 DIAGNOSIS — F418 Other specified anxiety disorders: Secondary | ICD-10-CM | POA: Diagnosis not present

## 2022-10-12 DIAGNOSIS — F3341 Major depressive disorder, recurrent, in partial remission: Secondary | ICD-10-CM

## 2022-10-12 MED ORDER — SERTRALINE HCL 100 MG PO TABS
100.0000 mg | ORAL_TABLET | Freq: Every day | ORAL | 1 refills | Status: DC
Start: 2022-10-12 — End: 2022-12-12

## 2022-10-12 MED ORDER — TRAZODONE HCL 50 MG PO TABS: 50.0000 mg | ORAL_TABLET | Freq: Every day | ORAL | 1 refills | Status: DC

## 2022-10-12 MED ORDER — HYDROXYZINE HCL 25 MG PO TABS: 25.0000 mg | ORAL_TABLET | Freq: Every day | ORAL | 1 refills | Status: DC

## 2022-10-12 MED ORDER — BUPROPION HCL ER (XL) 300 MG PO TB24: 300.0000 mg | ORAL_TABLET | Freq: Every day | ORAL | 1 refills | Status: DC

## 2022-10-12 NOTE — Progress Notes (Signed)
Virtual Visit via Video Note  I connected with Kevin Wilkerson on 10/12/22 at  1:00 PM EDT by a video enabled telemedicine application and verified that I am speaking with the correct person using two identifiers.  Location: Patient: home Provider: office   I discussed the limitations of evaluation and management by telemedicine and the availability of in person appointments. The patient expressed understanding and agreed to proceed.    I discussed the assessment and treatment plan with the patient. The patient was provided an opportunity to ask questions and all were answered. The patient agreed with the plan and demonstrated an understanding of the instructions.   The patient was advised to call back or seek an in-person evaluation if the symptoms worsen or if the condition fails to improve as anticipated.    [name], MD    [facility] MD/PA/NP OP Progress Note  10/12/2022 1:25 PM Kevin Wilkerson  MRN:  [id number]  Chief Complaint:   Medication management follow-up for depression, anxiety.  HPI:  This is a 22 year old Caucasian male with psychiatric history significant of ADHD, MDD, anxiety was last prescribed Wellbutrin XL 300 mg once a day, and Zoloft 100 mg once a day and hydroxyzine and trazodone as needed for sleeping difficulties.  He presents for follow-up after about 1.5 months.  Today he reported that he has been doing well, denied any new concerns for today's appointment.  He reported that he is anxious about completed test for driver's license on Monday.  He failed once by 1 question and therefore has been feeling more anxious.  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  Supportive counseling was provided to him and discussed to give his best attempt to pass the test.  He otherwise denied anxiety, other reasons.  He also denied any problems with mood, denied any low-dose or depressive episodes recently.  He reported that things are  going well at home with his parents and his fianc.  They got a new car and he is excited about it.  He has been having some difficulties with sleep because of being out of routine with his sleep.  We discussed sleep hygiene and improving the routine.  He verbalized understanding.  He denied any SI or HI.  He reported that he has been taking his medications as prescribed every day.  Discussed to continue with them and follow-up again in about 2 months or earlier if needed.   Visit Diagnosis:    ICD-10-CM   1. Recurrent major depressive disorder, in partial remission (HCC)  F33.41 buPROPion (WELLBUTRIN XL) 300 MG 24 hr tablet    2. Other specified anxiety disorders  F41.8 sertraline (ZOLOFT) 100 MG tablet    hydrOXYzine (ATARAX) 25 MG tablet         Past Psychiatric History: As mentioned in initial H&P, reviewed today, no change  Past Medical History:  Past Medical History:  Diagnosis Date   ADHD (attention deficit hyperactivity disorder)    Hypertension     Past Surgical History:  Procedure Laterality Date   arm surgery Left     Family Psychiatric History: As mentioned in initial H&P, reviewed today, no change  Family History:  Family History  Problem Relation Age of Onset   Bipolar disorder Mother    Depression Mother    Anxiety disorder Mother    Anxiety disorder Father    Depression Father    Panic disorder Brother  Social History:  Social History   Socioeconomic History   Marital status: Single    Spouse name: Not on file   Number of children: Not on file   Years of education: Not on file   Highest education level: 11th grade  Occupational History   Not on file  Tobacco Use   Smoking status: Never   Smokeless tobacco: Never  Vaping Use   Vaping status: Never Used  Substance and Sexual Activity   Alcohol use: Never   Drug use: Never   Sexual activity: Never  Other Topics Concern   Not on file  Social History Narrative   Not on file   Social  Determinants of Health   Financial Resource Strain: Not on file  Food Insecurity: Not on file  Transportation Needs: No Transportation Needs (01/04/2022)   Received [facility], [facility]   PRAPARE - Transportation    Lack of Transportation (Medical): No    Lack of Transportation (Non-Medical): No  Physical Activity: Not on file  Stress: Not on file  Social Connections: Not on file    Allergies: No Known Allergies  Metabolic Disorder Labs: No results found for: "HGBA1C", "MPG" No results found for: "PROLACTIN" No results found for: "CHOL", "TRIG", "HDL", "CHOLHDL", "VLDL", "LDLCALC" No results found for: "TSH"  Therapeutic Level Labs: No results found for: "LITHIUM" No results found for: "VALPROATE" No results found for: "CBMZ"  Current Medications: Current Outpatient Medications  Medication Sig Dispense Refill   buPROPion (WELLBUTRIN XL) 300 MG 24 hr tablet Take 1 tablet (300 mg total) by mouth daily. 30 tablet 1   hydrOXYzine (ATARAX) 25 MG tablet Take 1 tablet (25 mg total) by mouth at bedtime. Can take additional one tablet(25 mg total) at night for sleeping difficulties as needed. 30 tablet 1   sertraline (ZOLOFT) 100 MG tablet Take 1 tablet (100 mg total) by mouth daily. 30 tablet 1   traZODone (DESYREL) 50 MG tablet Take 1 tablet (50 mg total) by mouth at bedtime. TAKE 1/2 TO 1 (ONE-HALF TO ONE) TABLET BY MOUTH AT BEDTIME PRN  FOR  SLEEP 30 tablet 1   No current facility-administered medications for this visit.     Musculoskeletal: Strength & Muscle Tone: unable to assess since visit was over the telemedicine. Gait & Station: unable to assess since visit was over the telemedicine. Patient leans: N/A  Psychiatric Specialty Exam: Review of Systems  There were no vitals taken for this visit.There is no height or weight on file to calculate BMI.  General Appearance: Casual and Disheveled  Eye Contact:  Fair  Speech:  Clear and Coherent and Normal  Rate  Volume:  Normal  Mood:   "good..."  Affect:  Appropriate, Congruent, and Restricted  Thought Process:  Goal Directed and Linear  Orientation:  Full (Time, Place, and Person)  Thought Content: Logical   Suicidal Thoughts:  No  Homicidal Thoughts:  No  Memory:  Immediate;   Fair Recent;   Fair Remote;   Fair  Judgement:  Fair  Insight:  Fair  Psychomotor Activity:  Normal  Concentration:  Concentration: Fair and Attention Span: Fair  Recall:  [employer] of Knowledge: Fair  Language: Fair  Akathisia:  No    AIMS (if indicated): not done  Assets:  Manufacturing systems engineer Desire for Improvement Financial Resources/Insurance Housing Leisure Time Physical Health Social Support Transportation Vocational/Educational  ADL's:  Intact  Cognition: WNL  Sleep:  Fair     Screenings:  PHQ2-9    Flowsheet Row Video [facility]/19/[facility]  PHQ-2 Total Score 2  PHQ-9 Total Score 13      Flowsheet Row ED from 03/07/2020 in [facility]   C-SSRS RISK CATEGORY No Risk        Assessment and Plan:   22 year old Caucasian boy with 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   He reported history suggestive of significant anxiety, recurrent depression and ADHD on intake in the context of chronic psychosocial stressors and his genetic predisposition.    Reviewed response to current medications on 10/12/22.  He appears to have achieved remission in depressive symptoms and stability with his anxiety, has been compliant with his medications.  Recommending to continue with current medications.  Also discussed initiating therapy and was asked to look up therapist on psychology [email or web address].  He agreed on this.    Plan as mentioned below.     Plan:   # Anxiety(chronic, stable) - Continue with Zoloft 100 mg once a day.  - Atrax 25 mg  TID PRN for anxiety and QHS PRN for sleeping difficulties.  - Continue Wellbutrin XL 300 mg once a day. - Recommended to re-establish therapy   # Depression(recurrent, in partial remission) - Medication as mentioned above.    - Recommended to re-establish therapy   # ADHD - Consider restarting Adderall XR if needed in future.   # Sleep - Continue with Trazodone 50 mg once a day for sleep   This note was generated in part or whole with voice recognition software. Voice recognition is usually quite accurate but there are transcription errors that can and very often do occur. I apologize for any typographical errors that were not detected and corrected.   MDM = 2 or more chronic conditions + med management    [name], MD 10/12/2022, 1:25 PM

## 2022-12-12 ENCOUNTER — Other Ambulatory Visit: Payer: Self-pay | Admitting: Child and Adolescent Psychiatry

## 2022-12-12 DIAGNOSIS — F418 Other specified anxiety disorders: Secondary | ICD-10-CM

## 2022-12-12 DIAGNOSIS — F3341 Major depressive disorder, recurrent, in partial remission: Secondary | ICD-10-CM

## 2022-12-14 ENCOUNTER — Telehealth: Payer: BC Managed Care – PPO | Admitting: Child and Adolescent Psychiatry

## 2022-12-14 DIAGNOSIS — F418 Other specified anxiety disorders: Secondary | ICD-10-CM

## 2022-12-14 DIAGNOSIS — F3341 Major depressive disorder, recurrent, in partial remission: Secondary | ICD-10-CM | POA: Diagnosis not present

## 2022-12-14 MED ORDER — TRAZODONE HCL 50 MG PO TABS: 50.0000 mg | ORAL_TABLET | Freq: Every day | ORAL | 1 refills | Status: DC

## 2022-12-14 MED ORDER — BUPROPION HCL ER (XL) 300 MG PO TB24: 300.0000 mg | ORAL_TABLET | Freq: Every day | ORAL | 1 refills | Status: DC

## 2022-12-14 MED ORDER — SERTRALINE HCL 100 MG PO TABS: 150.0000 mg | ORAL_TABLET | Freq: Every day | ORAL | 1 refills | Status: DC

## 2022-12-14 NOTE — Progress Notes (Signed)
Virtual Visit via Video Note  I connected with Kevin Wilkerson on 12/14/22 at  1:00 PM EST by a video enabled telemedicine application and verified that I am speaking with the correct person using two identifiers.  Location: Patient: home Provider: office   I discussed the limitations of evaluation and management by telemedicine and the availability of in person appointments. The patient expressed understanding and agreed to proceed.    I discussed the assessment and treatment plan with the patient. The patient was provided an opportunity to ask questions and all were answered. The patient agreed with the plan and demonstrated an understanding of the instructions.   The patient was advised to call back or seek an in-person evaluation if the symptoms worsen or if the condition fails to improve as anticipated.    [name], MD    [facility] MD/PA/NP OP Progress Note  12/14/2022 1:21 PM Kevin Wilkerson  MRN:  [id number]  Chief Complaint:   Medication management follow-up for depression and anxiety.  HPI:  This is a 22 year old Caucasian male with psychiatric history significant of ADHD, MDD, anxiety was last prescribed Wellbutrin XL 300 mg once a day, and Zoloft 100 mg once a day and hydroxyzine and trazodone as needed for sleeping difficulties.  He presents for follow-up after about 2 months.  He reported that he has been feeling more depressed and anxious over the last 1 month.  He reported that he was looking for jobs and recently started working at [employer] stop which has increased his anxiety.  He reported that in the morning it is hard for him to get out of the bed, feels very depressed and irritable, as the day progresses, his mood looks better from the outside but he continues to feel depressed.  He reported that he is still sleeping about 9 to 10 hours every night, eating well, reports lower energy, denied any SI or HI.  He reported that he has been consistently taking his medications  without any side effects.  Other than interviewing for jobs and starting a new job he denied any other psychosocial stressors.  We discussed that his anxiety is most likely causing some challenges and wanting him to avoid which might be resulting in feelings of guilt and depression.  He agreed with this assessment.  We discussed to increase the dose of Zoloft to 150 mg daily while continuing rest of the current medications as it is.  He has not yet established outpatient psychotherapy and we discussed to make a referral to this clinic for therapy appointment.  He verbalized understanding and agreed with this plan.   Visit Diagnosis:    ICD-10-CM   1. Recurrent major depressive disorder, in partial remission (HCC)  F33.41 buPROPion (WELLBUTRIN XL) 300 MG 24 hr tablet    2. Other specified anxiety disorders  F41.8 sertraline (ZOLOFT) 100 MG tablet          Past Psychiatric History: As mentioned in initial H&P, reviewed today, no change  Past Medical History:  Past Medical History:  Diagnosis Date   ADHD (attention deficit hyperactivity disorder)    Hypertension     Past Surgical History:  Procedure Laterality Date   arm surgery Left     Family Psychiatric History: As mentioned in initial H&P, reviewed today, no change  Family History:  Family History  Problem Relation Age of Onset   Bipolar disorder Mother    Depression Mother    Anxiety disorder Mother    Anxiety  disorder Father    Depression Father    Panic disorder Brother     Social History:  Social History   Socioeconomic History   Marital status: Single    Spouse name: Not on file   Number of children: Not on file   Years of education: Not on file   Highest education level: 11th grade  Occupational History   Not on file  Tobacco Use   Smoking status: Never   Smokeless tobacco: Never  Vaping Use   Vaping status: Never Used  Substance and Sexual Activity   Alcohol use: Never   Drug use: Never   Sexual  activity: Never  Other Topics Concern   Not on file  Social History Narrative   Not on file   Social Determinants of Health   Financial Resource Strain: Not on file  Food Insecurity: Not on file  Transportation Needs: No Transportation Needs (01/04/2022)   [facility]   PRAPARE - Transportation    Lack of Transportation (Medical): No    Lack of Transportation (Non-Medical): No  Physical Activity: Not on file  Stress: Not on file  Social Connections: Not on file    Allergies: No Known Allergies  Metabolic Disorder Labs: No results found for: "HGBA1C", "MPG" No results found for: "PROLACTIN" No results found for: "CHOL", "TRIG", "HDL", "CHOLHDL", "VLDL", "LDLCALC" No results found for: "TSH"  Therapeutic Level Labs: No results found for: "LITHIUM" No results found for: "VALPROATE" No results found for: "CBMZ"  Current Medications: Current Outpatient Medications  Medication Sig Dispense Refill   buPROPion (WELLBUTRIN XL) 300 MG 24 hr tablet Take 1 tablet (300 mg total) by mouth daily. 30 tablet 1   hydrOXYzine (ATARAX) 25 MG tablet Take 1 tablet (25 mg total) by mouth at bedtime. Can take additional one tablet(25 mg total) at night for sleeping difficulties as needed. 30 tablet 1   sertraline (ZOLOFT) 100 MG tablet Take 1.5 tablets (150 mg total) by mouth daily. 45 tablet 1   traZODone (DESYREL) 50 MG tablet Take 1 tablet (50 mg total) by mouth at bedtime. 30 tablet 1   No current facility-administered medications for this visit.     Musculoskeletal: Strength & Muscle Tone: unable to assess since visit was over the telemedicine. Gait & Station: unable to assess since visit was over the telemedicine. Patient leans: N/A  Psychiatric Specialty Exam: Review of Systems  There were no vitals taken for this visit.There is no height or weight on file to calculate BMI.  General Appearance: Casual and Disheveled  Eye Contact:  Fair  Speech:   Clear and Coherent and Normal Rate  Volume:  Normal  Mood:   "good..."  Affect:  Appropriate, Congruent, and Restricted  Thought Process:  Goal Directed and Linear  Orientation:  Full (Time, Place, and Person)  Thought Content: Logical   Suicidal Thoughts:  No  Homicidal Thoughts:  No  Memory:  Immediate;   Fair Recent;   Fair Remote;   Fair  Judgement:  Fair  Insight:  Fair  Psychomotor Activity:  Normal  Concentration:  Concentration: Fair and Attention Span: Fair  Recall:  [employer] of Knowledge: Fair  Language: Fair  Akathisia:  No    AIMS (if indicated): not done  Assets:  Manufacturing systems engineer Desire for Improvement Financial Resources/Insurance Housing Leisure Time Physical Health Social Support Transportation Vocational/Educational  ADL's:  Intact  Cognition: WNL  Sleep:  Fair     Screenings:  PHQ2-9    Flowsheet Row Video Visit from 04/19/2020 in [facility]  PHQ-2 Total Score 2  PHQ-9 Total Score 13      Flowsheet Row ED from 03/07/2020 in [facility]   C-SSRS RISK CATEGORY No Risk        Assessment and Plan:   22 year old Caucasian boy with 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   He reported history suggestive of significant anxiety, recurrent depression and ADHD on intake in the context of chronic psychosocial stressors and his genetic predisposition.    Reviewed response to current medications on 12/14/22.  He appears to have worsening of depression and anxiety in the context of recent psychosocial stressors.  Recommending to increase the dose of Zoloft 150 mg daily while continuing rest of the current medications and follow-up in about 6 weeks or earlier if needed.    Plan as mentioned below.     Plan:   # Anxiety(chronic, stable) - Increase Zoloft to 150 mg once a day.  - Atrax 25 mg TID PRN  for anxiety and QHS PRN for sleeping difficulties.  - Continue Wellbutrin XL 300 mg once a day. - Recommended to re-establish therapy, referral sent to [facility] for therapy.    # Depression(recurrent, in partial remission) - Medication as mentioned above.    - Recommended to re-establish therapy   # ADHD - Consider restarting Adderall XR if needed in future.   # Sleep - Continue with Trazodone 50 mg once a day for sleep   This note was generated in part or whole with voice recognition software. Voice recognition is usually quite accurate but there are transcription errors that can and very often do occur. I apologize for any typographical errors that were not detected and corrected.       [name], MD 12/14/2022, 1:21 PM

## 2023-02-01 ENCOUNTER — Telehealth: Payer: Self-pay | Admitting: Child and Adolescent Psychiatry

## 2023-02-01 ENCOUNTER — Telehealth: Payer: BC Managed Care – PPO | Admitting: Child and Adolescent Psychiatry

## 2023-02-01 NOTE — Telephone Encounter (Signed)
Pt was sent link via text and email to connect on video for telemedicine encounter for scheduled appointment, and was also followed up with phone call. Pt did not connect on the video, and writer left the VM requesting to connect on the video or call back to reschedule appointment if they are not able to connect today for appointment.   

## 2023-02-05 ENCOUNTER — Ambulatory Visit (INDEPENDENT_AMBULATORY_CARE_PROVIDER_SITE_OTHER): Payer: Self-pay | Admitting: Physician Assistant

## 2023-02-05 ENCOUNTER — Encounter: Payer: Self-pay | Admitting: Physician Assistant

## 2023-02-05 VITALS — BP 128/96 | HR 87 | Temp 97.9°F | Ht 70.0 in | Wt 325.0 lb

## 2023-02-05 DIAGNOSIS — F1220 Cannabis dependence, uncomplicated: Secondary | ICD-10-CM | POA: Insufficient documentation

## 2023-02-05 DIAGNOSIS — F332 Major depressive disorder, recurrent severe without psychotic features: Secondary | ICD-10-CM

## 2023-02-05 DIAGNOSIS — F418 Other specified anxiety disorders: Secondary | ICD-10-CM

## 2023-02-05 DIAGNOSIS — R030 Elevated blood-pressure reading, without diagnosis of hypertension: Secondary | ICD-10-CM

## 2023-02-05 DIAGNOSIS — E559 Vitamin D deficiency, unspecified: Secondary | ICD-10-CM

## 2023-02-05 DIAGNOSIS — G4700 Insomnia, unspecified: Secondary | ICD-10-CM | POA: Insufficient documentation

## 2023-02-05 DIAGNOSIS — Z23 Encounter for immunization: Secondary | ICD-10-CM

## 2023-02-05 NOTE — Progress Notes (Signed)
Date:  02/05/2023   Name:  Kevin Wilkerson   DOB:  [birth year]   MRN:  [id number]   Chief Complaint: Establish Care and Hypertension (Wants to go on bp medication )  Hypertension This is a chronic problem. The current episode started more than 1 year ago. Associated symptoms include anxiety, blurred vision, headaches and malaise/fatigue. (floaters)   Kevin Wilkerson is a 22 year old male with history of HTN, HLD, obesity, vitamin D deficiency, MDD, anxiety, ADHD, new to the practice today to establish care.  Previously seen at [facility] primary care with last visit 01/16/2022; labs normal from that date aside from vitamin D of 14.0 and LDL 151. Last A1c 5.0%  He also has cannabis use disorder endorsing continuous use of cannabis 5-10 times daily through a variety of modalities, but primarily "joints" and vape.  Partner also with cannabis use disorder.  Hypersomnia with 12-14 hours of sleep nightly.  Typically wakes up between 12-2p, works for [employer], uses trazodone nightly for the past 6 months or so.  Prior to being prescribed trazodone, sleep was poor averaging 5 hours per night reporting trouble with sleep onset and maintenance.  Blood pressure has been high across multiple visits and locations, but he does not check at home.  Endorses transient vision changes during vigorous physical activity (mainly during intercourse) described as flickering/flares of light sometimes lasting upwards of 15 minutes, unsure if related to blood pressure. No associated headache or vision loss.   Severe anxiety and depression with occasional panic attacks described as feelings of acute worry, palpitations, and narrowed vision. Has never used BZD for this problem. Currently takes 150 mg sertraline daily and bupropion 300 mg daily.   Snores, uncertain if apneic.   Medication list has been reviewed and updated.  Current Meds  Medication Sig   buPROPion (WELLBUTRIN XL) 300 MG 24 hr tablet Take 1 tablet (300 mg total) by  mouth daily.   sertraline (ZOLOFT) 100 MG tablet Take 1.5 tablets (150 mg total) by mouth daily.   traZODone (DESYREL) 50 MG tablet Take 1 tablet (50 mg total) by mouth at bedtime.   [DISCONTINUED] hydrOXYzine (ATARAX) 25 MG tablet Take 1 tablet (25 mg total) by mouth at bedtime. Can take additional one tablet(25 mg total) at night for sleeping difficulties as needed.     Review of Systems  Constitutional:  Positive for malaise/fatigue.  Eyes:  Positive for blurred vision.  Neurological:  Positive for headaches.    Patient Active Problem List   Diagnosis Date Noted   Cannabis use disorder, moderate, dependence (HCC) 02/05/2023   Insomnia 02/05/2023   Vitamin D deficiency 01/16/2022   Recurrent major depressive disorder, in partial remission (HCC) 07/19/2020   Mixed anxiety and depressive disorder 07/19/2020   Attention deficit hyperactivity disorder (ADHD) 07/19/2020   Benign essential hypertension 02/16/2017   Class 3 severe obesity with serious comorbidity and body mass index (BMI) of 45.0 to 49.9 in adult (HCC) 02/16/2017    No Known Allergies  Immunization History  Administered Date(s) Administered   Dtap, Unspecified 03/28/2002, 06/02/2002, 07/30/2002, 10/06/2003, 03/28/2006   HIB, Unspecified 03/28/2002, 06/02/2002, 07/30/2002, 10/06/2003   HPV 9-valent 11/20/2013   HPV Quadrivalent 05/22/2013, 07/25/2013   Hep B, Unspecified 03/28/2002, 06/02/2002, 07/30/2002   Hepatitis A, Ped/Adol-2 Dose 05/22/2013, 09/24/2014   Influenza, Seasonal, Injecte, Preservative Fre 02/05/2023   Influenza,Quad,Nasal, Live 10/26/2011, 11/20/2013   Influenza,inj,Quad PF,6+ Mos 10/21/2015, 11/02/2016, 11/05/2017, 11/26/2018, 01/04/2022   Influenza-Unspecified 12/07/2016, 12/21/2016   MMR 02/10/2003, 03/28/2006  Meningococcal B, OMV 11/26/2018, 09/27/2020   Meningococcal Mcv4o 05/22/2013, 11/26/2018   PFIZER(Purple Top)SARS-COV-2 Vaccination 06/26/2019, 07/17/2019   Pneumococcal-Unspecified  03/28/2002, 06/02/2002, 07/30/2002, 02/10/2003   Polio, Unspecified 03/28/2002, 06/02/2002, 07/30/2002, 03/28/2006   Tdap 05/22/2013   Varicella 10/06/2003, 10/26/2011    Past Surgical History:  Procedure Laterality Date   arm surgery Left     Social History   Tobacco Use   Smoking status: Never   Smokeless tobacco: Never  Vaping Use   Vaping status: Every Day   Substances: THC  Substance Use Topics   Alcohol use: Yes    Alcohol/week: 4.0 standard drinks of alcohol    Types: 4 Cans of beer per week    Comment: 4 beers a week   Drug use: Yes    Types: Marijuana    Family History  Problem Relation Age of Onset   Bipolar disorder Mother    Depression Mother    Anxiety disorder Mother    Hypertension Father    Anxiety disorder Father    Depression Father    Panic disorder Brother    Diabetes Maternal Grandfather    Hypertension Paternal Grandmother    Stroke Paternal Grandfather    Hypertension Paternal Grandfather         02/05/2023    1:26 PM  GAD 7 : Generalized Anxiety Score  Nervous, Anxious, on Edge 3  Control/stop worrying 3  Worry too much - different things 3  Trouble relaxing 1  Restless 2  Easily annoyed or irritable 2  Afraid - awful might happen 2  Total GAD 7 Score 16  Anxiety Difficulty Somewhat difficult       02/05/2023    1:26 PM 04/19/2020    2:23 PM  Depression screen PHQ 2/9  Decreased Interest 2   Down, Depressed, Hopeless 2   PHQ - 2 Score 4   Altered sleeping 3   Tired, decreased energy 3   Change in appetite 3   Feeling bad or failure about yourself  2   Trouble concentrating 3   Moving slowly or fidgety/restless 2   Suicidal thoughts 0   PHQ-9 Score 20   Difficult doing work/chores Somewhat difficult      Information is confidential and restricted. Go to Review Flowsheets to unlock data.    BP Readings from Last 3 Encounters:  02/05/23 (!) 128/96  03/07/20 (!) 146/82  12/29/18 (!) 139/79 (97%, Z = 1.88 /  86%, Z =  1.08)*   *BP percentiles are based on the 2017 AAP Clinical Practice Guideline for boys    Wt Readings from Last 3 Encounters:  02/05/23 (!) 325 lb (147.4 kg)  03/07/20 240 lb (108.9 kg) (99%, Z= 2.32)*  12/28/18 210 lb (95.3 kg) (98%, Z= 1.97)*   * Growth percentiles are based on CDC (Boys, 2-20 Years) data.    BP (!) 128/96 (BP Location: Left Arm, Patient Position: Sitting, Cuff Size: Large)   Pulse 87   Temp 97.9 F (36.6 C) (Oral)   Ht 5\' 10"  (1.778 m)   Wt (!) 325 lb (147.4 kg)   SpO2 97%   BMI 46.63 kg/m   Physical Exam Vitals and nursing note reviewed.  Constitutional:      Appearance: Normal appearance.  Neck:     Vascular: No carotid bruit.  Cardiovascular:     Rate and Rhythm: Normal rate and regular rhythm.     Heart sounds: No murmur heard.    No friction rub. No gallop.  Pulmonary:     Effort: Pulmonary effort is normal.     Breath sounds: Normal breath sounds.  Abdominal:     General: There is no distension.  Musculoskeletal:        General: Normal range of motion.  Skin:    General: Skin is warm and dry.  Neurological:     Mental Status: He is alert and oriented to person, place, and time.     Gait: Gait is intact.  Psychiatric:        Mood and Affect: Mood and affect normal.     Recent Labs     Component Value Date/Time   NA 136 12/28/2018 2246   K 3.7 12/28/2018 2246   CL 102 12/28/2018 2246   CO2 24 12/28/2018 2246   GLUCOSE 116 (H) 12/28/2018 2246   BUN 22 (H) 12/28/2018 2246   CREATININE 0.90 12/28/2018 2246   CALCIUM 9.2 12/28/2018 2246   GFRNONAA NOT CALCULATED 12/28/2018 2246   GFRAA NOT CALCULATED 12/28/2018 2246    Lab Results  Component Value Date   WBC 9.5 12/28/2018   HGB 15.6 12/28/2018   HCT 45.4 12/28/2018   MCV 88.0 12/28/2018   PLT 211 12/28/2018   No results found for: "HGBA1C" No results found for: "CHOL", "HDL", "LDLCALC", "LDLDIRECT", "TRIG", "CHOLHDL" No results found for: "TSH"   Assessment and  Plan:  1. Cannabis use disorder, moderate, dependence (HCC) (Primary) Strongly suspect this is linked to most of his chronic concerns today.  Discussed the ability of cannabis to act both as a stimulant and a depressant.  Discussed his excessive use may be linked to high blood pressure, sleep disturbance, and mood disorders.  Encouraged gradual reduction of movement toward PRN use or ideally complete cessation.  Patient in agreement.  2. Elevated blood pressure reading in office without diagnosis of hypertension Reviewed the "Seven S's" of hypertension including salt, smoking, stimulants (e.g. caffeine), stress, sleep, snoring (OSA), sedentary lifestyle.  Advised home BP monitoring until next visit.  Will hold off on pharmacotherapy at this time.  Cannabis reduction will likely help.  3. Insomnia, unspecified type Hypersomnia with current dose of trazodone, encouraged him to take half tablet instead.  4. Severe episode of recurrent major depressive disorder, without psychotic features (HCC) For now, continue sertraline and bupropion at the current doses.  5. Vitamin D deficiency Encouraged continued vitamin D supplementation of 50,000 IU every 2 weeks  6. Need for influenza vaccination Flu shot given today - Flu vaccine trivalent PF, 6mos and older(Flulaval,Afluria,Fluarix,Fluzone)  7. Mixed anxiety and depressive disorder Considered use of BZD for panic attacks, but will wait for cannabis use to be better controlled.   Return in about 4 weeks (around 03/05/2023) for OV f/u CPE.    Alvester Morin, PA-C, DMSc, Nutritionist [facility] Primary Care and Sports Medicine [facility] [phone]

## 2023-02-05 NOTE — Patient Instructions (Addendum)
-  It was a pleasure to see you today! Please review your visit summary for helpful information -I would encourage you to follow your care via MyChart where you can access lab results, notes, messages, and more -If you feel that we did a nice job today, please complete your after-visit survey and leave [place] a Google review! Your CMA today was Mariann Barter and your provider was Alvester Morin, PA-C, DMSc -Please return for follow-up in about 4 weeks   Reviewed the "Seven S's" of hypertension including salt, smoking, stimulants (e.g. caffeine), stress, sleep, snoring (OSA), sedentary lifestyle.  Reviewed common practices to improve sleep hygiene including consistent sleep schedule, cool sleeping temperatures, keeping bedroom as dark as possible, daytime physical activity (especially resistance training), avoiding harmful bedtime habits (e.g. TV, phone use, etc),  avoiding oral intake within 1 hour of bed, avoiding evening caffeine/alcohol consumption.  Consider mindfulness exercises for sleep e.g. Calm app, yoga, etc.

## 2023-02-06 DIAGNOSIS — R0683 Snoring: Secondary | ICD-10-CM | POA: Insufficient documentation

## 2023-02-06 MED ORDER — TRAZODONE HCL 50 MG PO TABS: 25.0000 mg | ORAL_TABLET | Freq: Every day | ORAL | Status: DC

## 2023-03-06 ENCOUNTER — Encounter: Payer: Self-pay | Admitting: Physician Assistant

## 2023-03-06 ENCOUNTER — Ambulatory Visit (INDEPENDENT_AMBULATORY_CARE_PROVIDER_SITE_OTHER): Payer: BC Managed Care – PPO | Admitting: Physician Assistant

## 2023-03-06 VITALS — BP 136/74 | HR 95 | Temp 97.5°F | Ht 70.0 in | Wt 326.0 lb

## 2023-03-06 DIAGNOSIS — Z0000 Encounter for general adult medical examination without abnormal findings: Secondary | ICD-10-CM | POA: Diagnosis not present

## 2023-03-06 DIAGNOSIS — F418 Other specified anxiety disorders: Secondary | ICD-10-CM

## 2023-03-06 DIAGNOSIS — I861 Scrotal varices: Secondary | ICD-10-CM

## 2023-03-06 DIAGNOSIS — N50812 Left testicular pain: Secondary | ICD-10-CM | POA: Diagnosis not present

## 2023-03-06 DIAGNOSIS — F1220 Cannabis dependence, uncomplicated: Secondary | ICD-10-CM

## 2023-03-06 NOTE — Patient Instructions (Addendum)
-  It was a pleasure to see you today! Please review your visit summary for helpful information -Lab results are usually available within 1-2 days and we will call once reviewed -I would encourage you to follow your care via MyChart where you can access lab results, notes, messages, and more -If you feel that we did a nice job today, please complete your after-visit survey and leave [place] a Google review! Your CMA today was [place] and your provider was Alvester Morin, PA-C, DMSc

## 2023-03-06 NOTE — Progress Notes (Signed)
Date:  03/06/2023   Name:  Kevin Wilkerson   DOB:  June 04, 2000   MRN:  [id number]   Chief Complaint: Annual Exam  HPI Kevin Wilkerson returns for routine exam. Still endorses heavy use of marijuana daily. Continued anxiety and depression on sertraline and bupropion.   Mentions he has had left testicular pain since pediatrics, gradually becoming more frequent. By his report they just told him to wear supportive underwear, which he does.  Endorses occasional "tightening" of the left testicle which he has to "untighten". Usually this is fairly mild but on at least one occasion in the last 6 months this has been severe, though he did not seek ED treatment.   Last Physical: pediatrics Last Dental Exam: <1y ago Last Eye Exam: 1y ago   Medication list has been reviewed and updated.  Current Meds  Medication Sig   buPROPion (WELLBUTRIN XL) 300 MG 24 hr tablet Take 1 tablet (300 mg total) by mouth daily.   sertraline (ZOLOFT) 100 MG tablet Take 1.5 tablets (150 mg total) by mouth daily.   traZODone (DESYREL) 50 MG tablet Take 0.5 tablets (25 mg total) by mouth at bedtime.     Review of Systems  Constitutional:  Negative for activity change, appetite change, fatigue and unexpected weight change.  HENT:  Negative for dental problem, hearing loss and trouble swallowing.   Eyes:  Negative for visual disturbance.  Respiratory:  Negative for cough, chest tightness, shortness of breath and wheezing.   Cardiovascular:  Negative for chest pain, palpitations and leg swelling.  Gastrointestinal:  Negative for abdominal distention, abdominal pain, blood in stool, constipation and diarrhea.  Endocrine: Negative for polydipsia, polyphagia and polyuria.  Genitourinary:  Positive for testicular pain. Negative for difficulty urinating, dysuria, enuresis, frequency and hematuria.  Musculoskeletal:  Negative for arthralgias, gait problem and joint swelling.  Skin:  Negative for rash and wound.  Neurological:   Negative for dizziness, syncope, weakness and headaches.  Psychiatric/Behavioral:  Negative for behavioral problems and sleep disturbance.     Patient Active Problem List   Diagnosis Date Noted   Testicular pain, left 03/06/2023   Bilateral varicoceles 03/06/2023   Snoring 02/06/2023   Cannabis use disorder, moderate, dependence (HCC) 02/05/2023   Insomnia 02/05/2023   Vitamin D deficiency 01/16/2022   Recurrent major depressive disorder, in partial remission (HCC) 07/19/2020   Mixed anxiety and depressive disorder 07/19/2020   Attention deficit hyperactivity disorder (ADHD) 07/19/2020   Benign essential hypertension 02/16/2017   Class 3 severe obesity with serious comorbidity and body mass index (BMI) of 45.0 to 49.9 in adult (HCC) 02/16/2017    No Known Allergies  Immunization History  Administered Date(s) Administered   Dtap, Unspecified 03/28/2002, 06/02/2002, 07/30/2002, 10/06/2003, 03/28/2006   HIB, Unspecified 03/28/2002, 06/02/2002, 07/30/2002, 10/06/2003   HPV 9-valent 11/20/2013   HPV Quadrivalent 05/22/2013, 07/25/2013   Hep B, Unspecified 03/28/2002, 06/02/2002, 07/30/2002   Hepatitis A, Ped/Adol-2 Dose 05/22/2013, 09/24/2014   Influenza, Seasonal, Injecte, Preservative Fre 02/05/2023   Influenza,Quad,Nasal, Live 10/26/2011, 11/20/2013   Influenza,inj,Quad PF,6+ Mos 10/21/2015, 11/02/2016, 11/05/2017, 11/26/2018, 01/04/2022   Influenza-Unspecified 12/07/2016, 12/21/2016   MMR 02/10/2003, 03/28/2006   Meningococcal B, OMV 11/26/2018, 09/27/2020   Meningococcal Mcv4o 05/22/2013, 11/26/2018   PFIZER(Purple Top)SARS-COV-2 Vaccination 06/26/2019, 07/17/2019   Pneumococcal-Unspecified 03/28/2002, 06/02/2002, 07/30/2002, 02/10/2003   Polio, Unspecified 03/28/2002, 06/02/2002, 07/30/2002, 03/28/2006   Tdap 05/22/2013   Varicella 10/06/2003, 10/26/2011    Past Surgical History:  Procedure Laterality Date   arm surgery Left  Social History   Tobacco Use    Smoking status: Never   Smokeless tobacco: Never  Vaping Use   Vaping status: Every Day   Substances: THC  Substance Use Topics   Alcohol use: Yes    Alcohol/week: 4.0 standard drinks of alcohol    Types: 4 Cans of beer per week    Comment: 4 beers a week   Drug use: Yes    Types: Marijuana    Family History  Problem Relation Age of Onset   Bipolar disorder Mother    Depression Mother    Anxiety disorder Mother    Hypertension Father    Anxiety disorder Father    Depression Father    Panic disorder Brother    Diabetes Maternal Grandfather    Hypertension Paternal Grandmother    Stroke Paternal Grandfather    Hypertension Paternal Grandfather         03/06/2023    9:23 AM 02/05/2023    1:26 PM  GAD 7 : Generalized Anxiety Score  Nervous, Anxious, on Edge 3 3  Control/stop worrying 3 3  Worry too much - different things 3 3  Trouble relaxing 2 1  Restless 1 2  Easily annoyed or irritable 3 2  Afraid - awful might happen 2 2  Total GAD 7 Score 17 16  Anxiety Difficulty Somewhat difficult Somewhat difficult       03/06/2023    9:23 AM 02/05/2023    1:26 PM 04/19/2020    2:23 PM  Depression screen PHQ 2/9  Decreased Interest 2 2   Down, Depressed, Hopeless 2 2   PHQ - 2 Score 4 4   Altered sleeping 3 3   Tired, decreased energy 3 3   Change in appetite 3 3   Feeling bad or failure about yourself  3 2   Trouble concentrating 3 3   Moving slowly or fidgety/restless 0 2   Suicidal thoughts 0 0   PHQ-9 Score 19 20   Difficult doing work/chores Extremely dIfficult Somewhat difficult      Information is confidential and restricted. Go to Review Flowsheets to unlock data.    BP Readings from Last 3 Encounters:  03/06/23 136/74  02/05/23 (!) 128/96  03/07/20 (!) 146/82    Wt Readings from Last 3 Encounters:  03/06/23 (!) 326 lb (147.9 kg)  02/05/23 (!) 325 lb (147.4 kg)  03/07/20 240 lb (108.9 kg) (99%, Z= 2.32)*   * Growth percentiles are based on CDC  (Boys, 2-20 Years) data.    BP 136/74 (BP Location: Left Arm, Patient Position: Sitting, Cuff Size: Large)   Pulse 95   Temp (!) 97.5 F (36.4 C)   Ht 5\' 10"  (1.778 m)   Wt (!) 326 lb (147.9 kg)   SpO2 96%   BMI 46.78 kg/m   Physical Exam Vitals and nursing note reviewed.  Constitutional:      Appearance: Normal appearance.  HENT:     Ears:     Comments: EAC clear bilaterally with good view of TM which is without effusion or erythema.     Nose: Nose normal.     Mouth/Throat:     Mouth: Mucous membranes are moist. No oral lesions.     Dentition: Normal dentition.     Pharynx: No posterior oropharyngeal erythema.  Eyes:     Extraocular Movements: Extraocular movements intact.     Conjunctiva/sclera: Conjunctivae normal.     Pupils: Pupils are equal, round, and reactive to light.  Neck:     Thyroid: No thyromegaly.  Cardiovascular:     Rate and Rhythm: Normal rate and regular rhythm.     Heart sounds: No murmur heard.    No friction rub. No gallop.     Comments: Pulses 2+ at radial, PT, DP bilaterally. No carotid bruit. No peripheral edema Pulmonary:     Effort: Pulmonary effort is normal.     Breath sounds: Normal breath sounds.  Abdominal:     General: Bowel sounds are normal.     Palpations: Abdomen is soft. There is no mass.     Tenderness: There is no abdominal tenderness.     Hernia: There is no hernia in the left inguinal area or right inguinal area.  Genitourinary:    Testes:        Right: Varicocele present. Mass or tenderness not present.        Left: Varicocele present. Mass or tenderness not present.     Comments: Bilateral varicoceles more obvious with Valsalva. Musculoskeletal:     Comments: Full ROM with strength 5/5 bilateral upper and lower extremities  Lymphadenopathy:     Cervical: No cervical adenopathy.  Skin:    General: Skin is warm.     Capillary Refill: Capillary refill takes less than 2 seconds.     Findings: No lesion or rash.   Neurological:     Mental Status: He is alert and oriented to person, place, and time.     Gait: Gait is intact.  Psychiatric:        Mood and Affect: Mood normal.        Behavior: Behavior normal.     Recent Labs     Component Value Date/Time   NA 136 12/28/2018 2246   K 3.7 12/28/2018 2246   CL 102 12/28/2018 2246   CO2 24 12/28/2018 2246   GLUCOSE 116 (H) 12/28/2018 2246   BUN 22 (H) 12/28/2018 2246   CREATININE 0.90 12/28/2018 2246   CALCIUM 9.2 12/28/2018 2246   GFRNONAA NOT CALCULATED 12/28/2018 2246   GFRAA NOT CALCULATED 12/28/2018 2246    Lab Results  Component Value Date   WBC 9.5 12/28/2018   HGB 15.6 12/28/2018   HCT 45.4 12/28/2018   MCV 88.0 12/28/2018   PLT 211 12/28/2018   No results found for: "HGBA1C" No results found for: "CHOL", "HDL", "LDLCALC", "LDLDIRECT", "TRIG", "CHOLHDL" No results found for: "TSH"   Assessment and Plan:  1. Annual physical exam (Primary) Seemingly healthy patient with no abnormalities on exam. Encouraged healthy lifestyle including regular physical activity and consumption of whole fruits and vegetables. Encouraged routine dental and eye exams.   - CBC with Differential/Platelet - Comprehensive metabolic panel - TSH  2. Bilateral varicoceles Obtain scrotal ultrasound to verify.  If confirmed, we will consider urology referral for surgical repair - US Scrotum  3. Testicular pain, left Could be caused by varicoceles.  However, his description sounds similar to testicular torsion.  No acute concern for this today, but would be wise to obtain Doppler study at the time of scrotal ultrasound.  This has been requested. - US Scrotum  4. Mixed anxiety and depressive disorder Patient desires behavioral health referral - Ambulatory referral to Psychiatry  5. Cannabis use disorder, moderate, dependence (HCC) Again encouraged reduction/cessation.  Partner also heavy user.  Referring to behavioral health. - Ambulatory referral  to Psychiatry   Return in about 1 year (around 03/05/2024) for CPE.  Please also return in 2 months for  follow-up on chronic conditions.   Alvester Morin, PA-C, DMSc, Nutritionist [facility] Primary Care and Sports Medicine [facility] [phone]

## 2023-03-07 LAB — CBC WITH DIFFERENTIAL/PLATELET
Basophils Absolute: 0.1 10*3/uL (ref 0.0–0.2)
Basos: 1 %
EOS (ABSOLUTE): 0.2 10*3/uL (ref 0.0–0.4)
Eos: 2 %
Hematocrit: 45.6 % (ref 37.5–51.0)
Hemoglobin: 14.9 g/dL (ref 13.0–17.7)
Immature Grans (Abs): 0 10*3/uL (ref 0.0–0.1)
Immature Granulocytes: 0 %
Lymphocytes Absolute: 2.1 10*3/uL (ref 0.7–3.1)
Lymphs: 31 %
MCH: 29 pg (ref 26.6–33.0)
MCHC: 32.7 g/dL (ref 31.5–35.7)
MCV: 89 fL (ref 79–97)
Monocytes Absolute: 0.5 10*3/uL (ref 0.1–0.9)
Monocytes: 7 %
Neutrophils Absolute: 4 10*3/uL (ref 1.4–7.0)
Neutrophils: 59 %
Platelets: 190 10*3/uL (ref 150–450)
RBC: 5.14 x10E6/uL (ref 4.14–5.80)
RDW: 13.5 % (ref 11.6–15.4)
WBC: 6.8 10*3/uL (ref 3.4–10.8)

## 2023-03-07 LAB — COMPREHENSIVE METABOLIC PANEL
ALT: 26 [IU]/L (ref 0–44)
AST: 19 [IU]/L (ref 0–40)
Albumin: 4.3 g/dL (ref 4.3–5.2)
Alkaline Phosphatase: 89 [IU]/L (ref 44–121)
BUN/Creatinine Ratio: 23 — ABNORMAL HIGH (ref 9–20)
BUN: 17 mg/dL (ref 6–20)
Bilirubin Total: 0.3 mg/dL (ref 0.0–1.2)
CO2: 20 mmol/L (ref 20–29)
Calcium: 9.5 mg/dL (ref 8.7–10.2)
Chloride: 103 mmol/L (ref 96–106)
Creatinine, Ser: 0.75 mg/dL — ABNORMAL LOW (ref 0.76–1.27)
Globulin, Total: 2.5 g/dL (ref 1.5–4.5)
Glucose: 100 mg/dL — ABNORMAL HIGH (ref 70–99)
Potassium: 4.3 mmol/L (ref 3.5–5.2)
Sodium: 139 mmol/L (ref 134–144)
Total Protein: 6.8 g/dL (ref 6.0–8.5)
eGFR: 132 mL/min/{1.73_m2} (ref >59)

## 2023-03-07 LAB — TSH: TSH: 2.51 u[IU]/mL (ref 0.450–4.500)

## 2023-03-09 ENCOUNTER — Ambulatory Visit
Admission: RE | Admit: 2023-03-09 | Discharge: 2023-03-09 | Disposition: A | Discharge status: Home / Self Care | Payer: BC Managed Care – PPO | Source: Ambulatory Visit | Attending: Physician Assistant | Admitting: Physician Assistant

## 2023-03-09 DIAGNOSIS — I861 Scrotal varices: Secondary | ICD-10-CM | POA: Insufficient documentation

## 2023-03-09 DIAGNOSIS — N50812 Left testicular pain: Secondary | ICD-10-CM | POA: Insufficient documentation

## 2023-03-09 DIAGNOSIS — N50819 Testicular pain, unspecified: Secondary | ICD-10-CM | POA: Diagnosis not present

## 2023-03-12 ENCOUNTER — Other Ambulatory Visit: Payer: Self-pay | Admitting: Child and Adolescent Psychiatry

## 2023-03-12 ENCOUNTER — Other Ambulatory Visit: Payer: Self-pay | Admitting: Physician Assistant

## 2023-03-12 DIAGNOSIS — F418 Other specified anxiety disorders: Secondary | ICD-10-CM

## 2023-03-12 DIAGNOSIS — N50812 Left testicular pain: Secondary | ICD-10-CM

## 2023-03-23 ENCOUNTER — Ambulatory Visit: Payer: BC Managed Care – PPO | Admitting: Urology

## 2023-04-05 DIAGNOSIS — R202 Paresthesia of skin: Secondary | ICD-10-CM | POA: Diagnosis not present

## 2023-04-09 ENCOUNTER — Other Ambulatory Visit: Payer: Self-pay | Admitting: Child and Adolescent Psychiatry

## 2023-04-09 DIAGNOSIS — G4700 Insomnia, unspecified: Secondary | ICD-10-CM

## 2023-04-09 DIAGNOSIS — F3341 Major depressive disorder, recurrent, in partial remission: Secondary | ICD-10-CM

## 2023-04-13 ENCOUNTER — Other Ambulatory Visit: Payer: Self-pay

## 2023-04-13 DIAGNOSIS — N50812 Left testicular pain: Secondary | ICD-10-CM

## 2023-04-17 ENCOUNTER — Ambulatory Visit (INDEPENDENT_AMBULATORY_CARE_PROVIDER_SITE_OTHER): Payer: BC Managed Care – PPO | Admitting: Urology

## 2023-04-17 ENCOUNTER — Encounter: Payer: Self-pay | Admitting: Urology

## 2023-04-17 ENCOUNTER — Other Ambulatory Visit
Admission: RE | Admit: 2023-04-17 | Discharge: 2023-04-17 | Disposition: A | Discharge status: Home / Self Care | Attending: Urology | Admitting: Urology

## 2023-04-17 VITALS — BP 144/91 | Ht 70.0 in | Wt 326.0 lb

## 2023-04-17 DIAGNOSIS — N50812 Left testicular pain: Secondary | ICD-10-CM | POA: Diagnosis not present

## 2023-04-17 DIAGNOSIS — R102 Pelvic and perineal pain: Secondary | ICD-10-CM

## 2023-04-17 LAB — URINALYSIS, COMPLETE (UACMP) WITH MICROSCOPIC
Bacteria, UA: NONE SEEN
Bilirubin Urine: NEGATIVE
Glucose, UA: NEGATIVE mg/dL
Hgb urine dipstick: NEGATIVE
Ketones, ur: NEGATIVE mg/dL
Leukocytes,Ua: NEGATIVE
Nitrite: NEGATIVE
Protein, ur: NEGATIVE mg/dL
Specific Gravity, Urine: 1.02 (ref 1.005–1.030)
Squamous Epithelial / HPF: NONE SEEN /HPF (ref 0–5)
WBC, UA: NONE SEEN WBC/hpf (ref 0–5)
pH: 6.5 (ref 5.0–8.0)

## 2023-04-17 MED ORDER — CELECOXIB 200 MG PO CAPS: 200.0000 mg | ORAL_CAPSULE | Freq: Two times a day (BID) | ORAL | 0 refills | Status: DC

## 2023-04-17 NOTE — Progress Notes (Signed)
   04/17/23 3:28 PM   Kevin Wilkerson 01/27/2001 [id number]  CC: Left testicular pain  HPI: 23 year old male with history of anxiety/depression/ADHD who reports at least 10 years of intermittent left scrotal pain.  This seems to be worse with physical activity.  He describes it as a pulling and tightening sensation on the left side.  He has never had problems on the right side.  Recently had a scrotal ultrasound from January 2025 that was benign.   PMH: Past Medical History:  Diagnosis Date   ADHD (attention deficit hyperactivity disorder)    Anxiety    Depression    Hypertension    Insomnia     Surgical History: Past Surgical History:  Procedure Laterality Date   arm surgery Left     Family History: Family History  Problem Relation Age of Onset   Bipolar disorder Mother    Depression Mother    Anxiety disorder Mother    Hypertension Father    Anxiety disorder Father    Depression Father    Panic disorder Brother    Diabetes Maternal Grandfather    Hypertension Paternal Grandmother    Stroke Paternal Grandfather    Hypertension Paternal Grandfather     Social History:  reports that he has never smoked. He has never used smokeless tobacco. He reports current alcohol use of about 4.0 standard drinks of alcohol per week. He reports current drug use. Drug: Marijuana.  Physical Exam: BP (!) 144/91   Ht 5\' 10"  (1.778 m)   Wt (!) 326 lb (147.9 kg)   BMI 46.78 kg/m    Constitutional:  Alert and oriented, No acute distress. Cardiovascular: No clubbing, cyanosis, or edema. Respiratory: Normal respiratory effort, no increased work of breathing. GI: Abdomen is soft, nontender, nondistended, no abdominal masses GU: Testicles 20 cc bilaterally without masses, subtle horizontal lie of left testicle, no tenderness on exam  Pertinent Imaging: I have personally viewed and interpreted the scrotal ultrasound showing no abnormalities.  Assessment & Plan:   23 year old male  with 10 years of intermittent left testicular pain with physical activity.  Symptoms most consistent with pelvic floor dysfunction.  We discussed stretching exercises, NSAIDs, and more aggressive treatments like cord block.  He would like to start with a course of NSAIDs and stretching exercises, consider referral to pelvic floor PT in the future.  2 weeks Celebrex twice daily, pelvic floor stretching exercises provided RTC PA 6 weeks symptom check Consider referral to pelvic floor PT if no improvement, or if Kevin Wilkerson in [facility], MD 04/17/2023  [facility] [address], [address] [place], [place] [address] [phone]

## 2023-04-17 NOTE — Patient Instructions (Signed)
Pelvic Floor Dysfunction, Male     Pelvic floor dysfunction (PFD) is a condition that results when the group of muscles and connective tissues that support the organs in the pelvis (pelvic floor muscles) do not work well. These muscles and their connections form a sling that supports the colon and bladder. In men, these muscles also support the prostate gland. PFD causes pelvic floor muscles to be too weak, too tight, or both. In PFD, muscle movements are not coordinated. This may cause bowel or bladder problems. It may also cause pain. What are the causes? This condition may be caused by an injury to the pelvic area or by a weakening of pelvic muscles. In many cases, the exact cause is not known. What increases the risk? The following factors may make you more likely to develop PFD: Having chronic bladder tissue inflammation (interstitial cystitis). Being an older person. Being overweight. History of radiation treatment for cancer in the pelvic region. Previous pelvic surgery, such as removal of the prostate gland (prostatectomy). What are the signs or symptoms? Symptoms of this condition vary and may include: Bladder symptoms, such as: Trouble starting urination and emptying the bladder. Frequent urinary tract infections. Leaking urine when coughing, laughing, or exercising (stress incontinence). Having to pass urine urgently or frequently. Pain when passing urine. Bowel symptoms, such as: Constipation. Urgent or frequent bowel movements. Incomplete bowel movements. Painful bowel movements. Leaking stool or gas. Unexplained genital or rectal pain. Genital or rectal muscle spasms. Low back pain. Sexual dysfunction, such as erectile dysfunction, premature ejaculation, or pain during or after sexual activity. How is this diagnosed? This condition is diagnosed based on: Your symptoms and medical history. A physical exam. During the exam, your health care provider may check your  pelvic muscles for tightness, spasm, pain, or weakness. This may include a rectal exam. In some cases, you may have diagnostic tests, such as: Electrical muscle function tests. Urine flow testing. X-ray tests of bowel function. Ultrasound of the pelvic organs. How is this treated? Treatment for this condition depends on your symptoms. Treatment options include: Physical therapy. This may include Kegel exercises to help relax or strengthen the pelvic floor muscles. Biofeedback. This type of therapy provides feedback on how tight your pelvic floor muscles are so that you can learn to control them. Massage therapy. A treatment that involves electrical stimulation of the pelvic floor muscles to help control pain (transcutaneous electrical nerve stimulation, or TENS). Sound wave therapy (ultrasound) to reduce muscle spasms. Medicines, such as: Muscle relaxants. Bladder control medicines. Surgery to reconstruct or support pelvic floor muscles may be an option if other treatments do not help. Follow these instructions at home: Activity Do your usual activities as told by your health care provider. Ask your health care provider if you should modify any activities. Do pelvic floor strengthening or relaxing exercises at home as told by your physical therapist. Lifestyle Maintain a healthy weight. Eat foods that are high in fiber, such as beans, whole grains, and fresh fruits and vegetables. Limit foods that are high in fat and processed sugars, such as fried or sweet foods. Manage stress with relaxation techniques such as yoga or meditation. General instructions If you have problems with leakage: Use absorbable pads or wear padded underwear. Wash your genital and anal area frequently with mild soap. Keep your genital and anal area as clean and dry as possible. Ask your health care provider if you should try a barrier cream to prevent skin irritation. Take warm  baths to relieve pelvic muscle  tension or spasms. Take over-the-counter and prescription medicines only as told by your health care provider. Keep all follow-up visits. How is this prevented? The cause of PFD is not always known, but there are a few things you can do to reduce the risk of developing this condition, including: Staying at a healthy weight. Getting regular exercise. Managing stress. Contact a health care provider if: Your symptoms are not improving with home care. You have signs or symptoms of PFD that get worse. You develop new signs or symptoms. You have signs of a urinary tract infection, such as: Fever. Chills. Increased urinary frequency. A burning feeling when urinating. You have not had a bowel movement in 3 days (constipation). Summary Pelvic floor dysfunction results when the muscles and connective tissues in your pelvic floor do not work well. These muscles and their connections form a sling that supports your colon and bladder. In men, these muscles also support the prostate gland. PFD may be caused by an injury to the pelvic area or by a weakening of pelvic muscles. PFD causes pelvic floor muscles to be too weak, too tight, or a combination of both. Symptoms may vary from person to person. In most cases, PFD can be treated with physical therapies and medicines. Surgery may be an option if other treatments do not help. This information is not intended to replace advice given to you by your health care provider. Make sure you discuss any questions you have with your health care provider. Document Revised: 06/02/2020 Document Reviewed: 06/02/2020 Elsevier Patient Education  2024 [employer].

## 2023-05-07 ENCOUNTER — Other Ambulatory Visit: Payer: Self-pay | Admitting: Child and Adolescent Psychiatry

## 2023-05-07 DIAGNOSIS — F418 Other specified anxiety disorders: Secondary | ICD-10-CM

## 2023-05-10 ENCOUNTER — Encounter: Payer: Self-pay | Admitting: Urology

## 2023-06-04 ENCOUNTER — Ambulatory Visit: Admitting: Urology

## 2023-06-04 ENCOUNTER — Other Ambulatory Visit: Payer: Self-pay | Admitting: Child and Adolescent Psychiatry

## 2023-06-04 DIAGNOSIS — G4700 Insomnia, unspecified: Secondary | ICD-10-CM

## 2023-06-11 ENCOUNTER — Ambulatory Visit: Admitting: Urology

## 2023-06-18 ENCOUNTER — Ambulatory Visit (INDEPENDENT_AMBULATORY_CARE_PROVIDER_SITE_OTHER)

## 2023-06-18 ENCOUNTER — Ambulatory Visit
Admission: EM | Admit: 2023-06-18 | Discharge: 2023-06-18 | Disposition: A | Discharge status: Home / Self Care | Attending: Emergency Medicine | Admitting: Emergency Medicine

## 2023-06-18 DIAGNOSIS — M25571 Pain in right ankle and joints of right foot: Secondary | ICD-10-CM | POA: Diagnosis not present

## 2023-06-18 DIAGNOSIS — M19071 Primary osteoarthritis, right ankle and foot: Secondary | ICD-10-CM

## 2023-06-18 MED ORDER — METHYLPREDNISOLONE 4 MG PO TBPK: ORAL_TABLET | ORAL | 0 refills | Status: DC

## 2023-06-18 NOTE — ED Provider Notes (Signed)
 [facility]    CSN: [id number] Arrival date & time: 06/18/23  1621      History   Chief Complaint Chief Complaint  Patient presents with   Ankle Pain    HPI Kevin PATENAUDE is a 23 y.o. male.   HPI  23 year old male with past medical history significant for insomnia, hypertension, depression, anxiety, and ADHD presents for evaluation of pain in his right foot and ankle that have been going on for the past week.  He reports that he just woke up 1 morning with the pain but has continued to go to work and walk on it.  It hurts both at rest as well as with weightbearing.  The pain is described as a sharp pain.  It is on the lateral aspect of his right ankle as well as the sole of his foot between the balls of his feet.  He denies any injury.  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  He wears hightop sneakers.  He reports that his current shoes are approximately 29 month old.  Past Medical History:  Diagnosis Date   ADHD (attention deficit hyperactivity disorder)    Anxiety    Depression    Hypertension    Insomnia     Patient Active Problem List   Diagnosis Date Noted   Testicular pain, left 03/06/2023   Snoring 02/06/2023   Cannabis use disorder, moderate, dependence (HCC) 02/05/2023   Insomnia 02/05/2023   Vitamin D deficiency 01/16/2022   Recurrent major depressive disorder, in partial remission (HCC) 07/19/2020   Mixed anxiety and depressive disorder 07/19/2020   Attention deficit hyperactivity disorder (ADHD) 07/19/2020   Benign essential hypertension 02/16/2017   Class 3 severe obesity with serious comorbidity and body mass index (BMI) of 45.0 to 49.9 in adult 02/16/2017    Past Surgical History:  Procedure Laterality Date   arm surgery Left        Home Medications    Prior to Admission medications   Medication Sig Start Date End Date Taking? Authorizing Provider  buPROPion  (WELLBUTRIN  XL) 300 MG 24 hr tablet  TAKE 1 TABLET BY MOUTH EVERY DAY 04/09/23  Yes Umrania, Hiren M, MD  celecoxib  (CELEBREX ) 200 MG capsule Take 1 capsule (200 mg total) by mouth 2 (two) times daily. 04/17/23  Yes Lawerence Pressman, MD  methylPREDNISolone (MEDROL DOSEPAK) 4 MG TBPK tablet Take according to the package insert. 06/18/23  Yes Kent Pear, NP  sertraline  (ZOLOFT ) 100 MG tablet TAKE 1.5 TABLETS (150MG  TOTAL) BY MOUTH DAILY 05/07/23  Yes Alysia Bachelor, MD  traZODone  (DESYREL ) 50 MG tablet TAKE 1 TABLET BY MOUTH EVERYDAY AT BEDTIME 06/04/23  Yes Pilar Bridge, MD    Family History Family History  Problem Relation Age of Onset   Bipolar disorder Mother    Depression Mother    Anxiety disorder Mother    Hypertension Father    Anxiety disorder Father    Depression Father    Panic disorder Brother    Diabetes Maternal Grandfather    Hypertension Paternal Grandmother    Stroke Paternal Grandfather    Hypertension Paternal Grandfather     Social History Social History   Tobacco Use   Smoking status: Never   Smokeless tobacco: Never  Vaping Use   Vaping status: Every Day   Substances: THC  Substance Use Topics   Alcohol use: Yes    Alcohol/week: 4.0 standard drinks of alcohol  Types: 4 Cans of beer per week    Comment: 4 beers a week   Drug use: Yes    Types: Marijuana     Allergies   Patient has no known allergies.   Review of Systems Review of Systems  Musculoskeletal:  Positive for arthralgias and myalgias. Negative for joint swelling.  Skin:  Negative for color change.  Neurological:  Negative for numbness.     Physical Exam Triage Vital Signs ED Triage Vitals  Encounter Vitals Group     BP 06/18/23 1624 136/86     Systolic BP Percentile --      Diastolic BP Percentile --      Pulse Rate 06/18/23 1624 76     Resp 06/18/23 1624 16     Temp 06/18/23 1624 98 F (36.7 C)     Temp Source 06/18/23 1624 Oral     SpO2 06/18/23 1624 96 %     Weight 06/18/23 1623 (!) 305 lb (138.3 kg)      Height 06/18/23 1623 5\' 11"  (1.803 m)     Head Circumference --      Peak Flow --      Pain Score 06/18/23 1626 8     Pain Loc --      Pain Education --      Exclude from Growth Chart --    No data found.  Updated Vital Signs BP 136/86 (BP Location: Right Arm)   Pulse 76   Temp 98 F (36.7 C) (Oral)   Resp 16   Ht 5\' 11"  (1.803 m)   Wt (!) 305 lb (138.3 kg)   SpO2 96%   BMI 42.54 kg/m   Visual Acuity Right Eye Distance:   Left Eye Distance:   Bilateral Distance:    Right Eye Near:   Left Eye Near:    Bilateral Near:     Physical Exam Vitals and nursing note reviewed.  Constitutional:      Appearance: Normal appearance.  Musculoskeletal:        General: No swelling, tenderness or signs of injury.  Skin:    General: Skin is warm.     Capillary Refill: Capillary refill takes less than 2 seconds.     Findings: No bruising or erythema.  Neurological:     General: No focal deficit present.     Mental Status: He is alert and oriented to person, place, and time.      UC Treatments / Results  Labs (all labs ordered are listed, but only abnormal results are displayed) Labs Reviewed - No data to display  EKG   Radiology No results found.  Procedures Procedures (including critical care time)  Medications Ordered in UC Medications - No data to display  Initial Impression / Assessment and Plan / UC Course  I have reviewed the triage vital signs and the nursing notes.  Pertinent labs & imaging results that were available during my care of the patient were reviewed by me and considered in my medical decision making (see chart for details).   Patient is a pleasant, nontoxic-appearing 23 year old male presenting for evaluation of pain in the lateral aspect of his right ankle as well as in the sole of his foot between the balls of his right foot as outlined HPI above.  He has normal range of motion of his foot and ankle as well as normal sensation.  DP and PT  pulses are 2+ and cap refill is less than 3 seconds.  I  cannot reproduce the pain with palpation of the ankle joint or with palpation of the foot.  He has no appreciable edema, erythema, or ecchymosis.  He reports that they foot will swell up and occasionally it has been hot but he denies any redness.  He has no history of previous injury to the right foot.  I will obtain radiograph of right foot and ankle rule out any bony abnormality.  Right ankle x-ray independently reviewed and evaluated by me.  Impression: No evidence of fracture or dislocation.  Ankle mortise is well-maintained.  Soft tissue is unremarkable.  Radiology overread is pending.  Right foot x-rays independently reviewed and evaluated by me.  Impression: No evidence of fracture or dislocation.  Soft tissues are unremarkable.  Radiology overread is pending.  I will discharge patient on the diagnosis of arthropathy of his right foot and ankle and start him on a Medrol Dosepak that he can start tomorrow morning for pain and inflammation.  I will also give him the contact information for [facility] so that he may follow-up if his symptoms do not improve.  I will also suggest that he change the inserts in his shoes to see if they make a difference.   Final Clinical Impressions(s) / UC Diagnoses   Final diagnoses:  Pain in right ankle and joints of right foot  Arthropathy of right foot  Arthropathy of right ankle     Discharge Instructions      Your x-rays did not show any evidence of broken bones.  I suspect that you have inflammation of the soft tissues in your right foot and ankle, possibly secondary to your foot wear.  You may want to consider purchasing a pair of insoles, such as Dr. Randon Butters, and seeing if they make a difference and improve your symptoms.  You may also want to go to a specialty store, such as the [facility], where you can be fitted for custom orthotics.  I am going to prescribe you a Medrol  Dosepak to help decrease inflammation.  Start that tomorrow morning and take it according to the package instructions.  If your symptoms did not improve with the Medrol Dosepak and new inserts I want you to follow-up with podiatry.  I have given you the contact information for [facility] in [place].  Call them to make an appointment.   ED Prescriptions     Medication Sig Dispense Auth. Provider   methylPREDNISolone (MEDROL DOSEPAK) 4 MG TBPK tablet Take according to the package insert. 1 each Kent Pear, NP      PDMP not reviewed this encounter.   Kent Pear, NP 06/18/23 1720

## 2023-06-18 NOTE — ED Triage Notes (Signed)
 Pt c/o R ankle pain x1 wk. States he woke up in pain. Denies any falls or injuries.

## 2023-06-18 NOTE — Discharge Instructions (Signed)
 Your x-rays did not show any evidence of broken bones.  I suspect that you have inflammation of the soft tissues in your right foot and ankle, possibly secondary to your foot wear.  You may want to consider purchasing a pair of insoles, such as Dr. Randon Butters, and seeing if they make a difference and improve your symptoms.  You may also want to go to a specialty store, such as the [facility] store, where you can be fitted for custom orthotics.  I am going to prescribe you a Medrol Dosepak to help decrease inflammation.  Start that tomorrow morning and take it according to the package instructions.  If your symptoms did not improve with the Medrol Dosepak and new inserts I want you to follow-up with podiatry.  I have given you the contact information for [facility] in [place].  Call them to make an appointment.

## 2023-06-30 ENCOUNTER — Other Ambulatory Visit: Payer: Self-pay | Admitting: Child and Adolescent Psychiatry

## 2023-06-30 DIAGNOSIS — F3341 Major depressive disorder, recurrent, in partial remission: Secondary | ICD-10-CM

## 2023-07-02 ENCOUNTER — Other Ambulatory Visit: Payer: Self-pay | Admitting: Psychiatry

## 2023-07-02 DIAGNOSIS — F418 Other specified anxiety disorders: Secondary | ICD-10-CM

## 2023-07-09 DIAGNOSIS — F411 Generalized anxiety disorder: Secondary | ICD-10-CM | POA: Diagnosis not present

## 2023-07-16 DIAGNOSIS — F411 Generalized anxiety disorder: Secondary | ICD-10-CM | POA: Diagnosis not present

## 2023-07-26 DIAGNOSIS — F411 Generalized anxiety disorder: Secondary | ICD-10-CM | POA: Diagnosis not present

## 2023-07-29 ENCOUNTER — Other Ambulatory Visit: Payer: Self-pay | Admitting: Child and Adolescent Psychiatry

## 2023-07-29 DIAGNOSIS — G4700 Insomnia, unspecified: Secondary | ICD-10-CM

## 2023-07-30 DIAGNOSIS — F411 Generalized anxiety disorder: Secondary | ICD-10-CM | POA: Diagnosis not present

## 2023-08-07 DIAGNOSIS — F411 Generalized anxiety disorder: Secondary | ICD-10-CM | POA: Diagnosis not present

## 2023-08-14 DIAGNOSIS — F411 Generalized anxiety disorder: Secondary | ICD-10-CM | POA: Diagnosis not present

## 2023-08-21 DIAGNOSIS — F411 Generalized anxiety disorder: Secondary | ICD-10-CM | POA: Diagnosis not present

## 2023-08-28 DIAGNOSIS — F1290 Cannabis use, unspecified, uncomplicated: Secondary | ICD-10-CM | POA: Diagnosis not present

## 2023-08-28 DIAGNOSIS — F341 Dysthymic disorder: Secondary | ICD-10-CM | POA: Diagnosis not present

## 2023-08-28 DIAGNOSIS — F411 Generalized anxiety disorder: Secondary | ICD-10-CM | POA: Diagnosis not present

## 2023-09-05 DIAGNOSIS — F411 Generalized anxiety disorder: Secondary | ICD-10-CM | POA: Diagnosis not present

## 2023-09-12 DIAGNOSIS — F411 Generalized anxiety disorder: Secondary | ICD-10-CM | POA: Diagnosis not present

## 2023-09-24 DIAGNOSIS — F341 Dysthymic disorder: Secondary | ICD-10-CM | POA: Diagnosis not present

## 2023-09-24 DIAGNOSIS — F411 Generalized anxiety disorder: Secondary | ICD-10-CM | POA: Diagnosis not present

## 2023-10-01 DIAGNOSIS — F411 Generalized anxiety disorder: Secondary | ICD-10-CM | POA: Diagnosis not present

## 2023-10-08 DIAGNOSIS — F411 Generalized anxiety disorder: Secondary | ICD-10-CM | POA: Diagnosis not present

## 2023-10-15 DIAGNOSIS — F411 Generalized anxiety disorder: Secondary | ICD-10-CM | POA: Diagnosis not present

## 2023-10-31 DIAGNOSIS — F411 Generalized anxiety disorder: Secondary | ICD-10-CM | POA: Diagnosis not present

## 2023-11-05 DIAGNOSIS — F411 Generalized anxiety disorder: Secondary | ICD-10-CM | POA: Diagnosis not present

## 2023-11-29 DIAGNOSIS — F341 Dysthymic disorder: Secondary | ICD-10-CM | POA: Diagnosis not present

## 2023-11-29 DIAGNOSIS — F411 Generalized anxiety disorder: Secondary | ICD-10-CM | POA: Diagnosis not present

## 2023-12-14 DIAGNOSIS — F411 Generalized anxiety disorder: Secondary | ICD-10-CM | POA: Diagnosis not present

## 2023-12-20 DIAGNOSIS — F411 Generalized anxiety disorder: Secondary | ICD-10-CM | POA: Diagnosis not present

## 2023-12-27 DIAGNOSIS — F411 Generalized anxiety disorder: Secondary | ICD-10-CM | POA: Diagnosis not present

## 2024-01-01 DIAGNOSIS — F411 Generalized anxiety disorder: Secondary | ICD-10-CM | POA: Diagnosis not present

## 2024-01-10 DIAGNOSIS — F341 Dysthymic disorder: Secondary | ICD-10-CM | POA: Diagnosis not present

## 2024-01-10 DIAGNOSIS — F411 Generalized anxiety disorder: Secondary | ICD-10-CM | POA: Diagnosis not present

## 2024-01-17 ENCOUNTER — Ambulatory Visit: Admitting: Physician Assistant

## 2024-01-18 ENCOUNTER — Telehealth: Payer: Self-pay | Admitting: Physician Assistant

## 2024-01-18 ENCOUNTER — Ambulatory Visit: Admitting: Physician Assistant

## 2024-01-18 VITALS — BP 156/96 | HR 94 | Temp 97.9°F | Ht 71.0 in | Wt 350.0 lb

## 2024-01-18 DIAGNOSIS — Z23 Encounter for immunization: Secondary | ICD-10-CM | POA: Diagnosis not present

## 2024-01-18 DIAGNOSIS — I10 Essential (primary) hypertension: Secondary | ICD-10-CM | POA: Diagnosis not present

## 2024-01-18 DIAGNOSIS — G43009 Migraine without aura, not intractable, without status migrainosus: Secondary | ICD-10-CM | POA: Insufficient documentation

## 2024-01-18 MED ORDER — NURTEC 75 MG PO TBDP: 1.0000 | ORAL_TABLET | ORAL | 1 refills | Status: DC | PRN

## 2024-01-18 MED ORDER — METOPROLOL SUCCINATE ER 50 MG PO TB24: 50.0000 mg | ORAL_TABLET | Freq: Every day | ORAL | 1 refills | Status: AC

## 2024-01-18 NOTE — Telephone Encounter (Signed)
 Please complete prior auth for Nurtec for migraines. He has not tried other migraine medication.

## 2024-01-18 NOTE — Assessment & Plan Note (Signed)
 Patient was given a sample of Nurtec 75 mg in-office and started to improve within 10 min. Rx sent but may need PA. He was given a co-pay card as well.   Also starting metoprolol for BP, tachy, and migraines. Hopefully the cardioselective nature will not further contribute to weight gain.

## 2024-01-18 NOTE — Progress Notes (Signed)
 Date:  01/18/2024   Name:  Kevin Wilkerson   DOB:  2000-08-11   MRN:  [id number]   Chief Complaint: Hypertension and Headache (1 week, may be due to high BP, only on left side of head and eye, throbbing)  HPI   Kevin Wilkerson presents with off-and-on left sided headache involving scalp and periorbital area for the last week, with associated mild nausea and sensitivity to light and sound, but no aura. History of migraines, but not since childhood. Started naltrexone 2 weeks ago. Also stressed with holidays.   Does not have BP cuff but feels like his BP is high. Weight is up 24 lb since I last saw him.   Medication list has been reviewed and updated.  Active Medications[1]   Review of Systems  Patient Active Problem List   Diagnosis Date Noted   Migraine without aura and without status migrainosus, not intractable 01/18/2024   Testicular pain, left 03/06/2023   Snoring 02/06/2023   Cannabis use disorder, moderate, dependence (HCC) 02/05/2023   Insomnia 02/05/2023   Vitamin D deficiency 01/16/2022   Recurrent major depressive disorder, in partial remission 07/19/2020   Mixed anxiety and depressive disorder 07/19/2020   Attention deficit hyperactivity disorder (ADHD) 07/19/2020   Benign essential hypertension 02/16/2017   Class 3 severe obesity with serious comorbidity and body mass index (BMI) of 45.0 to 49.9 in adult (HCC) 02/16/2017    Allergies[2]  Immunization History  Administered Date(s) Administered   Dtap, Unspecified 03/28/2002, 06/02/2002, 07/30/2002, 10/06/2003, 03/28/2006   HIB, Unspecified 03/28/2002, 06/02/2002, 07/30/2002, 10/06/2003   HPV 9-valent 11/20/2013   HPV Quadrivalent 05/22/2013, 07/25/2013   Hep B, Unspecified 03/28/2002, 06/02/2002, 07/30/2002   Hepatitis A, Ped/Adol-2 Dose 05/22/2013, 09/24/2014   Influenza, Seasonal, Injecte, Preservative Fre 02/05/2023, 01/18/2024   Influenza,Quad,Nasal, Live 10/26/2011, 11/20/2013   Influenza,inj,Quad PF,6+ Mos  10/21/2015, 11/02/2016, 11/05/2017, 11/26/2018, 01/04/2022   Influenza-Unspecified 12/07/2016, 12/21/2016   MMR 02/10/2003, 03/28/2006   Meningococcal B, OMV 11/26/2018, 09/27/2020   Meningococcal Mcv4o 05/22/2013, 11/26/2018   PFIZER(Purple Top)SARS-COV-2 Vaccination 06/26/2019, 07/17/2019   Pneumococcal-Unspecified 03/28/2002, 06/02/2002, 07/30/2002, 02/10/2003   Polio, Unspecified 03/28/2002, 06/02/2002, 07/30/2002, 03/28/2006   Tdap 05/22/2013   Varicella 10/06/2003, 10/26/2011    Past Surgical History:  Procedure Laterality Date   arm surgery Left     Social History[3]  Family History  Problem Relation Age of Onset   Bipolar disorder Mother    Depression Mother    Anxiety disorder Mother    Hypertension Father    Anxiety disorder Father    Depression Father    Panic disorder Brother    Diabetes Maternal Grandfather    Hypertension Paternal Grandmother    Stroke Paternal Grandfather    Hypertension Paternal Grandfather         01/18/2024    4:23 PM 03/06/2023    9:23 AM 02/05/2023    1:26 PM  GAD 7 : Generalized Anxiety Score  Nervous, Anxious, on Edge 3 3 3   Control/stop worrying 3 3 3   Worry too much - different things 3 3 3   Trouble relaxing 3 2 1   Restless 0 1 2  Easily annoyed or irritable 3 3 2   Afraid - awful might happen 3 2 2   Total GAD 7 Score 18 17 16   Anxiety Difficulty Somewhat difficult Somewhat difficult Somewhat difficult       01/18/2024    4:23 PM 03/06/2023    9:23 AM 02/05/2023    1:26 PM  Depression screen PHQ 2/9  Decreased Interest 1 2 2   Down, Depressed, Hopeless 2 2 2   PHQ - 2 Score 3 4 4   Altered sleeping 1 3 3   Tired, decreased energy 2 3 3   Change in appetite 3 3 3   Feeling bad or failure about yourself  2 3 2   Trouble concentrating 0 3 3  Moving slowly or fidgety/restless 0 0 2  Suicidal thoughts 0 0 0  PHQ-9 Score 11 19  20    Difficult doing work/chores Somewhat difficult Extremely dIfficult Somewhat difficult      Data saved with a previous flowsheet row definition    BP Readings from Last 3 Encounters:  01/18/24 (!) 156/96  06/18/23 136/86  04/17/23 (!) 144/91    Wt Readings from Last 3 Encounters:  01/18/24 (!) 350 lb (158.8 kg)  06/18/23 (!) 305 lb (138.3 kg)  04/17/23 (!) 326 lb (147.9 kg)    BP (!) 156/96 (Cuff Size: Large)   Pulse 94   Temp 97.9 F (36.6 C)   Ht 5' 11 (1.803 m)   Wt (!) 350 lb (158.8 kg)   SpO2 97%   BMI 48.82 kg/m   Physical Exam Vitals and nursing note reviewed.  Constitutional:      Appearance: Normal appearance.  Cardiovascular:     Rate and Rhythm: Normal rate.  Pulmonary:     Effort: Pulmonary effort is normal.  Abdominal:     General: There is no distension.  Musculoskeletal:        General: Normal range of motion.  Skin:    General: Skin is warm and dry.  Neurological:     Mental Status: He is alert and oriented to person, place, and time.     Gait: Gait is intact.  Psychiatric:        Mood and Affect: Mood and affect normal.     Recent Labs     Component Value Date/Time   NA 139 03/06/2023 1021   K 4.3 03/06/2023 1021   CL 103 03/06/2023 1021   CO2 20 03/06/2023 1021   GLUCOSE 100 (H) 03/06/2023 1021   GLUCOSE 116 (H) 12/28/2018 2246   BUN 17 03/06/2023 1021   CREATININE 0.75 (L) 03/06/2023 1021   CALCIUM 9.5 03/06/2023 1021   PROT 6.8 03/06/2023 1021   ALBUMIN 4.3 03/06/2023 1021   AST 19 03/06/2023 1021   ALT 26 03/06/2023 1021   ALKPHOS 89 03/06/2023 1021   BILITOT 0.3 03/06/2023 1021   GFRNONAA NOT CALCULATED 12/28/2018 2246   GFRAA NOT CALCULATED 12/28/2018 2246    Lab Results  Component Value Date   WBC 6.8 03/06/2023   HGB 14.9 03/06/2023   HCT 45.6 03/06/2023   MCV 89 03/06/2023   PLT 190 03/06/2023   No results found for: HGBA1C No results found for: CHOL, HDL, LDLCALC, LDLDIRECT, TRIG, CHOLHDL Lab Results  Component Value Date   TSH 2.510 03/06/2023      Assessment and Plan:  Migraine  without aura and without status migrainosus, not intractable Assessment & Plan: Patient was given a sample of Nurtec 75 mg in-office and started to improve within 10 min. Rx sent but may need PA. He was given a co-pay card as well.   Also starting metoprolol for BP, tachy, and migraines. Hopefully the cardioselective nature will not further contribute to weight gain.   Orders: -     Nurtec; Take 1 tablet (75 mg total) by mouth every other day as needed.  Dispense: 30 tablet; Refill: 1 -  Metoprolol Succinate ER; Take 1 tablet (50 mg total) by mouth daily. Take with or immediately following a meal.  Dispense: 90 tablet; Refill: 1  Benign essential hypertension Assessment & Plan: Begin metoprolol. Encouraged home BP monitoring.   Orders: -     Metoprolol Succinate ER; Take 1 tablet (50 mg total) by mouth daily. Take with or immediately following a meal.  Dispense: 90 tablet; Refill: 1  Encounter for immunization -     Flu vaccine trivalent PF, 6mos and older(Flulaval,Afluria,Fluarix,Fluzone)     F/u in 10 days OV HTN, headaches   Rolan Hoyle, PA-C, DMSc, Nutritionist [facility] [phone]      [1]  Current Meds  Medication Sig   ARIPiprazole (ABILIFY) 2 MG tablet    lamoTRIgine (LAMICTAL) 150 MG tablet    metoprolol succinate (TOPROL-XL) 50 MG 24 hr tablet Take 1 tablet (50 mg total) by mouth daily. Take with or immediately following a meal.   naltrexone (DEPADE) 50 MG tablet    NURTEC 75 MG TBDP Take 1 tablet (75 mg total) by mouth every other day as needed.   traZODone  (DESYREL ) 50 MG tablet TAKE 1 TABLET BY MOUTH EVERYDAY AT BEDTIME   [DISCONTINUED] sertraline  (ZOLOFT ) 100 MG tablet TAKE 1.5 TABLETS (150MG  TOTAL) BY MOUTH DAILY  [2] No Known Allergies [3]  Social History Tobacco Use   Smoking status: Never   Smokeless tobacco: Never  Vaping Use   Vaping status: Every Day    Substances: THC  Substance Use Topics   Alcohol use: Yes    Alcohol/week: 4.0 standard drinks of alcohol    Types: 4 Cans of beer per week    Comment: 4 beers a week   Drug use: Yes    Types: Marijuana

## 2024-01-18 NOTE — Assessment & Plan Note (Signed)
 Begin metoprolol. Encouraged home BP monitoring.

## 2024-01-21 ENCOUNTER — Other Ambulatory Visit: Payer: Self-pay | Admitting: Physician Assistant

## 2024-01-21 ENCOUNTER — Telehealth: Payer: Self-pay | Admitting: Pharmacy Technician

## 2024-01-21 ENCOUNTER — Other Ambulatory Visit (HOSPITAL_COMMUNITY): Payer: Self-pay

## 2024-01-21 DIAGNOSIS — G43009 Migraine without aura, not intractable, without status migrainosus: Secondary | ICD-10-CM

## 2024-01-21 MED ORDER — NURTEC 75 MG PO TBDP: 1.0000 | ORAL_TABLET | ORAL | 5 refills | Status: AC | PRN

## 2024-01-21 NOTE — Telephone Encounter (Signed)
 Pharmacy Patient Advocate Encounter  Received notification from [facility] that Prior Authorization for Nurtec 75MG  dispersible tablets has been APPROVED from 01/21/24 to 07/21/24. Ran test claim, Copay is $0.00. This test claim was processed through [facility]- copay amounts may vary at other pharmacies due to pharmacy/plan contracts, or as the patient moves through the different stages of their insurance plan.   PA #/Case ID/Reference #: [id number]  **The prescription was written for #30 but it's in his best interest to fill #16 for 30 days because his copay is $0.00 for 1 month and $100.00 for 2 months**

## 2024-01-21 NOTE — Telephone Encounter (Signed)
 Pharmacy Patient Advocate Encounter   Received notification from Pt Calls Messages that prior authorization for Nurtec 75MG  dispersible tablets is required/requested.   Insurance verification completed.   The patient is insured through [employer].   Per test claim: PA required; PA started via CoverMyMeds. KEY [id number] . Waiting for clinical questions to populate.

## 2024-01-21 NOTE — Telephone Encounter (Signed)
 PA request has been Submitted. New Encounter has been or will be created for follow up. For additional info see Pharmacy Prior Auth telephone encounter from 01/21/24.

## 2024-01-22 ENCOUNTER — Telehealth: Payer: Self-pay

## 2024-01-22 NOTE — Telephone Encounter (Signed)
 Copied from CRM #[id number]. Topic: Clinical - Medication Prior Auth >> Jan 21, 2024  3:39 PM Corin V wrote: Reason for CRM: Madiel with Insurance prior authorization team calling and advising on prior authorization decision. Auth for Nurtec approved. Reference: [id number]. Shara is approved 01/21/24-07/21/24.

## 2024-01-24 ENCOUNTER — Telehealth: Payer: Self-pay

## 2024-01-24 NOTE — Telephone Encounter (Signed)
 Nurtec was approved.  KP

## 2024-01-25 DIAGNOSIS — F411 Generalized anxiety disorder: Secondary | ICD-10-CM | POA: Diagnosis not present

## 2024-01-28 ENCOUNTER — Ambulatory Visit: Admitting: Physician Assistant

## 2024-01-28 ENCOUNTER — Encounter: Payer: Self-pay | Admitting: Physician Assistant
